# Patient Record
Sex: Male | Born: 1980
Health system: Southern US, Community
[De-identification: ages and names within clinical notes are randomized; demographics above are authoritative.]

---

## 2012-06-02 ENCOUNTER — Emergency Department (HOSPITAL_COMMUNITY): Payer: Commercial Managed Care - PPO

## 2012-06-02 ENCOUNTER — Emergency Department (HOSPITAL_COMMUNITY)
Admission: EM | Admit: 2012-06-02 | Discharge: 2012-06-02 | Disposition: A | Discharge status: Home / Self Care | Payer: Commercial Managed Care - PPO | Attending: Emergency Medicine | Admitting: Emergency Medicine

## 2012-06-02 ENCOUNTER — Encounter (HOSPITAL_COMMUNITY): Payer: Self-pay | Admitting: *Deleted

## 2012-06-02 DIAGNOSIS — Z79899 Other long term (current) drug therapy: Secondary | ICD-10-CM | POA: Insufficient documentation

## 2012-06-02 DIAGNOSIS — M545 Low back pain, unspecified: Secondary | ICD-10-CM | POA: Insufficient documentation

## 2012-06-02 DIAGNOSIS — M549 Dorsalgia, unspecified: Secondary | ICD-10-CM

## 2012-06-02 MED ORDER — KETOROLAC TROMETHAMINE 60 MG/2ML IM SOLN
60.0000 mg | Freq: Once | INTRAMUSCULAR | Status: AC
  Administered 2012-06-02: 60 mg via INTRAMUSCULAR
  Filled 2012-06-02: qty 2

## 2012-06-02 MED ORDER — MELOXICAM 15 MG PO TABS: 15.0000 mg | ORAL_TABLET | Freq: Every day | ORAL | Status: DC

## 2012-06-02 MED ORDER — DEXAMETHASONE SODIUM PHOSPHATE 10 MG/ML IJ SOLN
10.0000 mg | Freq: Once | INTRAMUSCULAR | Status: AC
  Administered 2012-06-02: 10 mg via INTRAMUSCULAR
  Filled 2012-06-02: qty 1

## 2012-06-02 MED ORDER — HYDROCODONE-ACETAMINOPHEN 5-325 MG PO TABS: 1.0000 | ORAL_TABLET | ORAL | Status: DC | PRN

## 2012-06-02 NOTE — ED Notes (Signed)
PA at bedside.

## 2012-06-02 NOTE — ED Provider Notes (Signed)
History     CSN: [id number]  Arrival date & time 06/02/12  1239   First MD Initiated Contact with Patient 06/02/12 1257      Chief Complaint  Patient presents with  . Back Pain    (Consider location/radiation/quality/duration/timing/severity/associated sxs/prior treatment) HPI Comments:  Ricardo Shah is a 32 y.o. male who complains of low back pain for 1 month, the patient has had acutely worsening back pain over the past week. Today he reached out to open a drawer and had severe back pain and spasm. Positional with bending or lifting, without radiation down the legs.  Prior history of back problems: no prior back problems. There is no numbness in the legs.Denies weakness, loss of bowel/bladder function or saddle anesthesia. Denies neck stiffness, headache, rash.  Denies fever or recent procedures to back.        PLAN: For acute pain, rest, intermittent application of heat (do not sleep on heating pad), analgesics and muscle relaxants are recommended. Discussed longer term treatment plan of prn NSAID's and discussed a home back care exercise program with flexion exercise routine. Proper lifting with avoidance of heavy lifting discussed. Consider Physical Therapy and XRay studies if not improving. Call or return to clinic prn if these symptoms worsen or fail to improve as anticipated.   Patient is a 32 y.o. male presenting with back pain. The history is provided by the patient. No language interpreter was used.  Back Pain Location:  Lumbar spine and gluteal region Quality:  Aching Radiates to:  Does not radiate Pain severity:  Severe Pain is:  Worse during the day Onset quality:  Sudden Timing:  Constant Progression:  Worsening Chronicity:  New Context: not emotional stress, not falling, not jumping from heights, not lifting heavy objects, not MCA, not MVA, not occupational injury, not pedestrian accident, not physical stress, not recent illness, not recent injury and  not twisting   Relieved by:  Lying down Worsened by:  Ambulation, bending, coughing and deep breathing Associated symptoms: no abdominal pain, no abdominal swelling, no bladder incontinence, no bowel incontinence, no chest pain, no dysuria, no fever, no headaches, no leg pain, no numbness, no paresthesias, no pelvic pain, no perianal numbness, no tingling, no weakness and no weight loss   Risk factors: lack of exercise   Risk factors: no hx of cancer, no hx of osteoporosis, not obese, not pregnant, no recent surgery, no steroid use and no vascular disease     History reviewed. No pertinent past medical history.  History reviewed. No pertinent past surgical history.  History reviewed. No pertinent family history.  History  Substance Use Topics  . Smoking status: Never Smoker   . Smokeless tobacco: Not on file  . Alcohol Use: 1.8 oz/week    3 Cans of beer per week      Review of Systems  Constitutional: Negative for fever and weight loss.  Cardiovascular: Negative for chest pain.  Gastrointestinal: Negative for abdominal pain and bowel incontinence.  Genitourinary: Negative for bladder incontinence, dysuria and pelvic pain.  Musculoskeletal: Positive for back pain.  Neurological: Negative for tingling, weakness, numbness, headaches and paresthesias.    Allergies  Review of patient's allergies indicates no known allergies.  Home Medications   Current Outpatient Rx  Name  Route  Sig  Dispense  Refill  . carisoprodol (SOMA) 250 MG tablet   Oral   Take 350 mg by mouth at bedtime as needed. For pain         .  DULoxetine (CYMBALTA) 30 MG capsule   Oral   Take 30 mg by mouth daily.           BP 135/88  Pulse 87  Temp(Src) 98 F (36.7 C)  Resp 20  SpO2 99%  Physical Exam BP 135/88  Pulse 87  Temp(Src) 98 F (36.7 C)  Resp 20  SpO2 99%  CV: RRR, No M/R/G, Peripheral pulses intact. No peripheral edema. Lungs: CTAB Abd: Soft, Non tender, non distended Patient  appears to be in mild to moderate pain, antalgic gait noted. Lumbosacral spine area reveals no local tenderness or mass.  Painful and reduced LS ROM noted. Straight leg raise is negative}. DTR's, motor strength and sensation normal, including heel and toe gait.  Peripheral pulses are palpable. X-Ray: no fracture or dislocation noted. ED Course  Procedures (including critical care time)  Labs Reviewed - No data to display Dg Lumbar Spine Complete  06/02/2012   *RADIOLOGY REPORT*  Clinical Data: Low back/hip pain  LUMBAR SPINE - COMPLETE 4+ VIEW  Comparison: None.  Findings: Five lumbar-type vertebral bodies.  Normal lumbar lordosis.  No evidence of fracture or dislocation.  The vertebral body heights and intervertebral disc spaces are maintained.  Visualized bony pelvis appears intact.  Very mild degenerative changes at L3-4.  IMPRESSION: No fracture or dislocation is seen.   Original Report Authenticated By: Charline Bills, M.D.   Dg Sacrum/coccyx  06/02/2012   *RADIOLOGY REPORT*  Clinical Data: Low back/hip pain  SACRUM AND COCCYX - 2+ VIEW  Comparison: None.  Findings: No displaced sacral brachial fracture is seen.  Visualized bony pelvis appears intact.  Lower lumbar spine is within normal limits.  IMPRESSION: No displaced sacrococcygeal fracture is seen.   Original Report Authenticated By: Charline Bills, M.D.   Dg Hip Bilateral Vito Berger  06/02/2012   *RADIOLOGY REPORT*  Clinical Data: Low back/hip pain  BILATERAL HIP WITH PELVIS - 4+ VIEW  Comparison: None.  Findings: No fracture or dislocation is seen.  Bilateral hip joint spaces are preserved.  Lower lumbar spine is within normal limits.  Visualized bony pelvis appears intact.  IMPRESSION: No acute osseous abnormality is seen.   Original Report Authenticated By: Charline Bills, M.D.     1. Back pain       MDM  7:25 PM Patient with back pain.  No neurological deficits and normal neuro exam.  Patient can walk but states is painful.   No loss of bowel or bladder control.  No concern for cauda equina.  No fever, night sweats, weight loss, h/o cancer, IVDU.  RICE protocol and pain medicine indicated and discussed with patient.  F/U appointment tomorrow pcp.        [name], PA-C 06/04/12 1926

## 2012-06-02 NOTE — ED Notes (Signed)
Pt reports lower back pain x couple of weeks.  States that he went to his PCP yesterday and was given soma for muscle relaxer without relief.  States that today, his back 'gave out' and he was unable to ambulate without pain.  Pt did have a urinalysis yesterday that was clear.

## 2012-06-05 NOTE — ED Provider Notes (Signed)
  Medical screening examination/treatment/procedure(s) were performed by non-physician practitioner and as supervising physician I was immediately available for consultation/collaboration.    Davena Julian D Willye Javier, MD 06/05/12 0720 

## 2014-08-29 IMAGING — CR DG SACRUM/COCCYX 2+V
3 series · 3 of 3 positions shown · non-contrast
Comparison: None.

CLINICAL DATA: Low back/hip pain

SACRUM AND COCCYX - 2+ VIEW

[t sacrum a.p.]
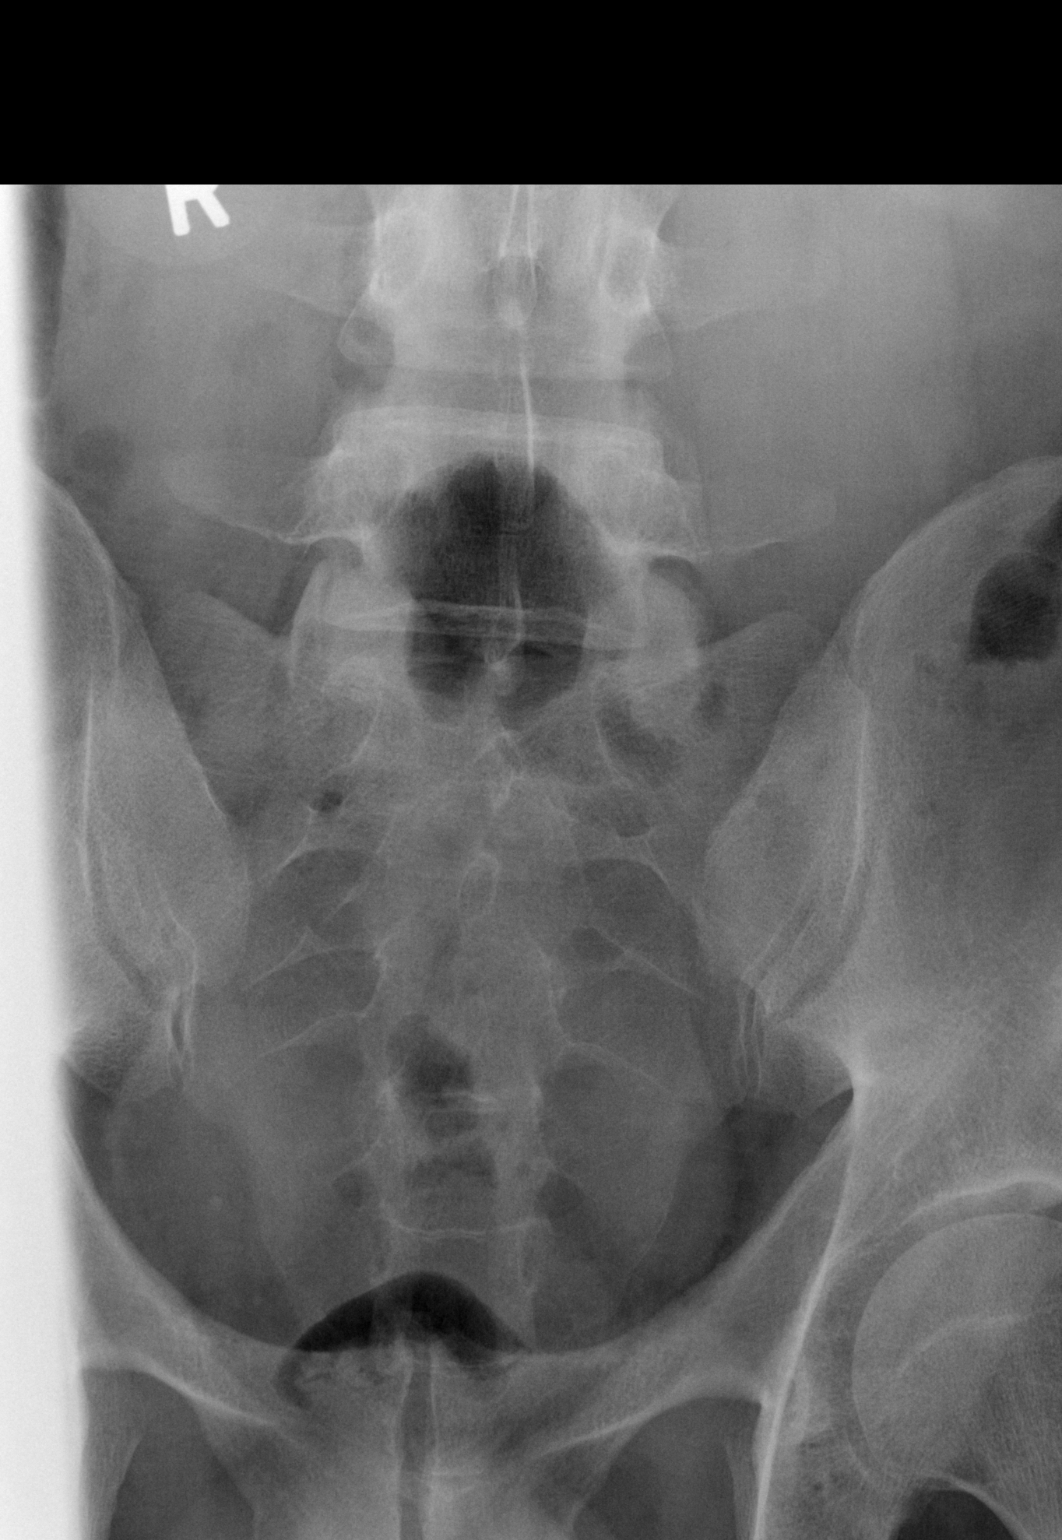

[t sacrum lat (1 of 2)]
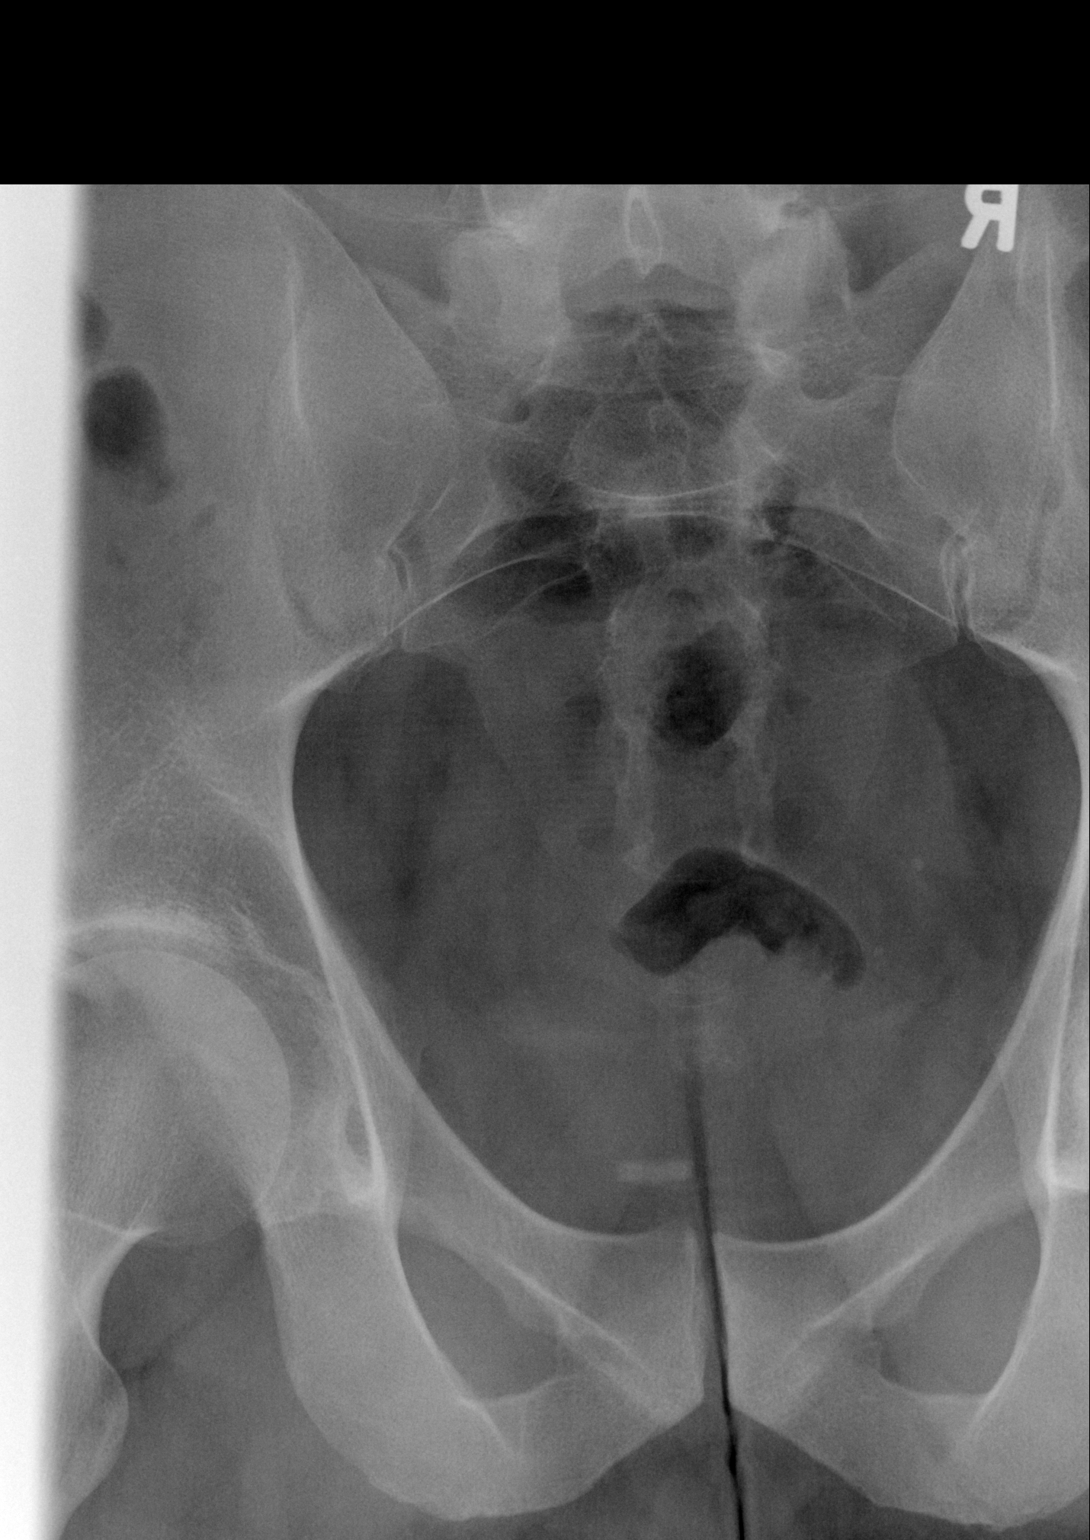

[t sacrum lat (2 of 2)]
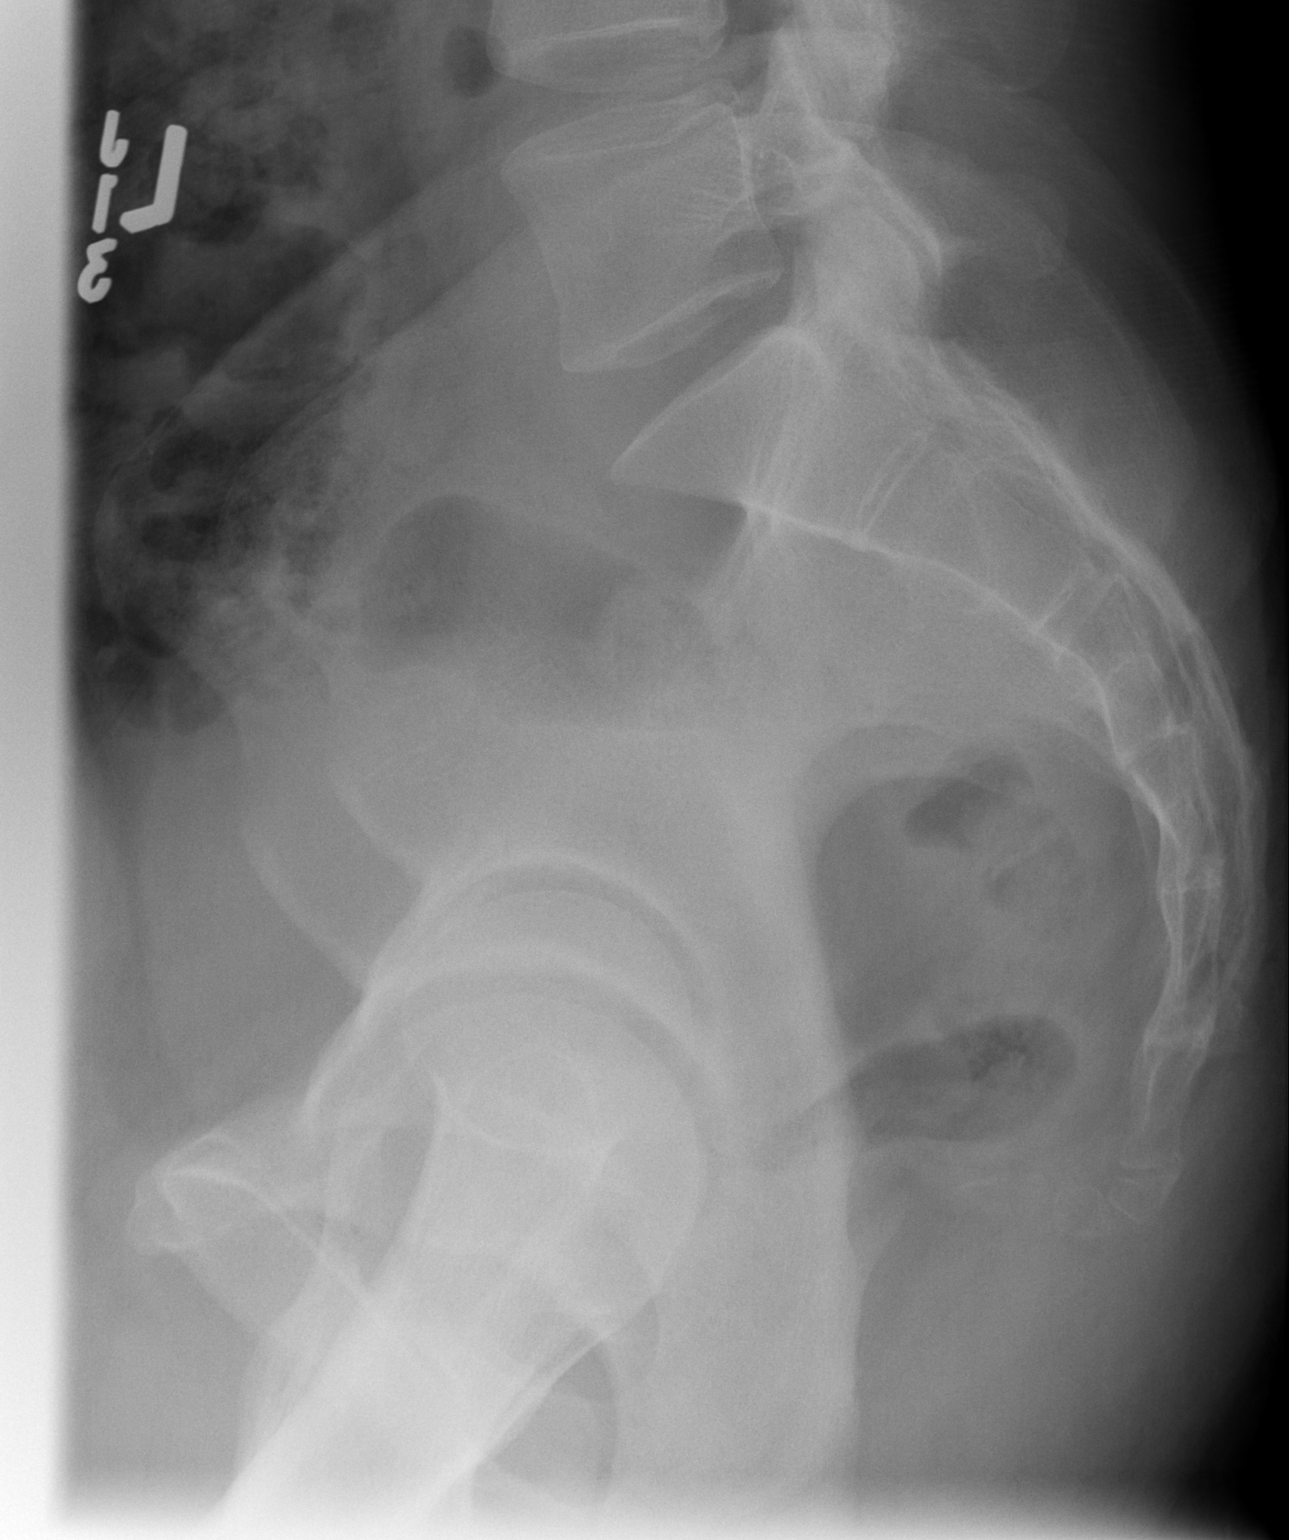

[3 of 3 positions shown; findings below may reference images not displayed]

FINDINGS: No displaced sacral brachial fracture is seen.

Visualized bony pelvis appears intact.

Lower lumbar spine is within normal limits.
IMPRESSION: No displaced sacrococcygeal fracture is seen.

## 2014-08-29 IMAGING — CR DG LUMBAR SPINE COMPLETE 4+V
5 series · 5 of 5 positions shown · non-contrast
Comparison: None.

CLINICAL DATA: Low back/hip pain

LUMBAR SPINE - COMPLETE 4+ VIEW

[t l-spine a.p.]
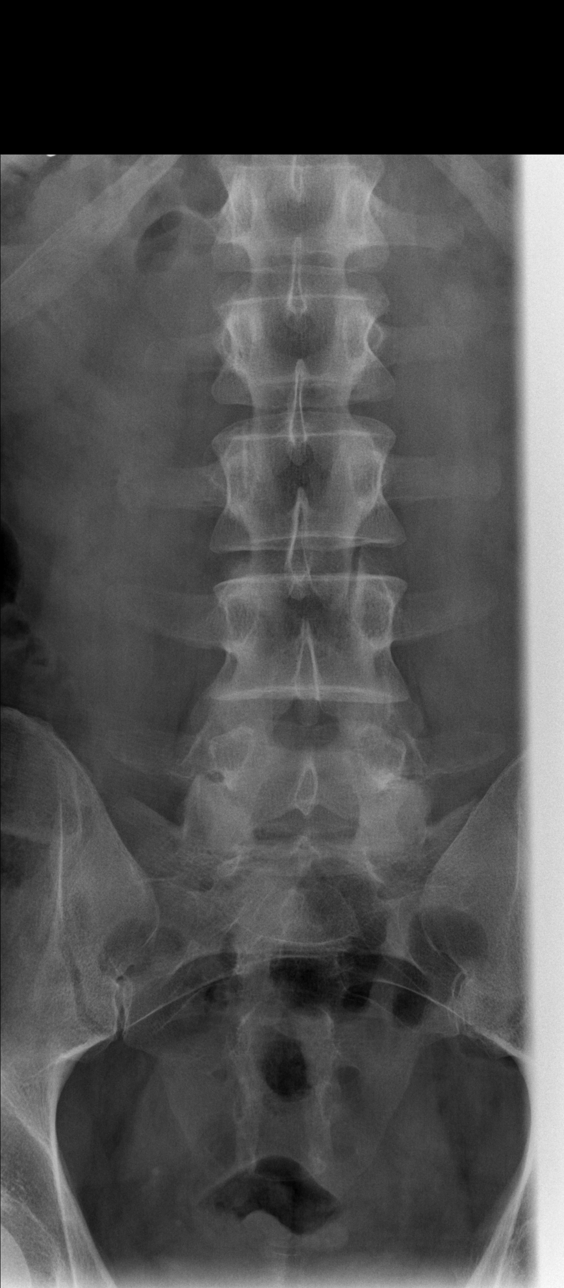

[t l-spine oblique exposure (1 of 2)]
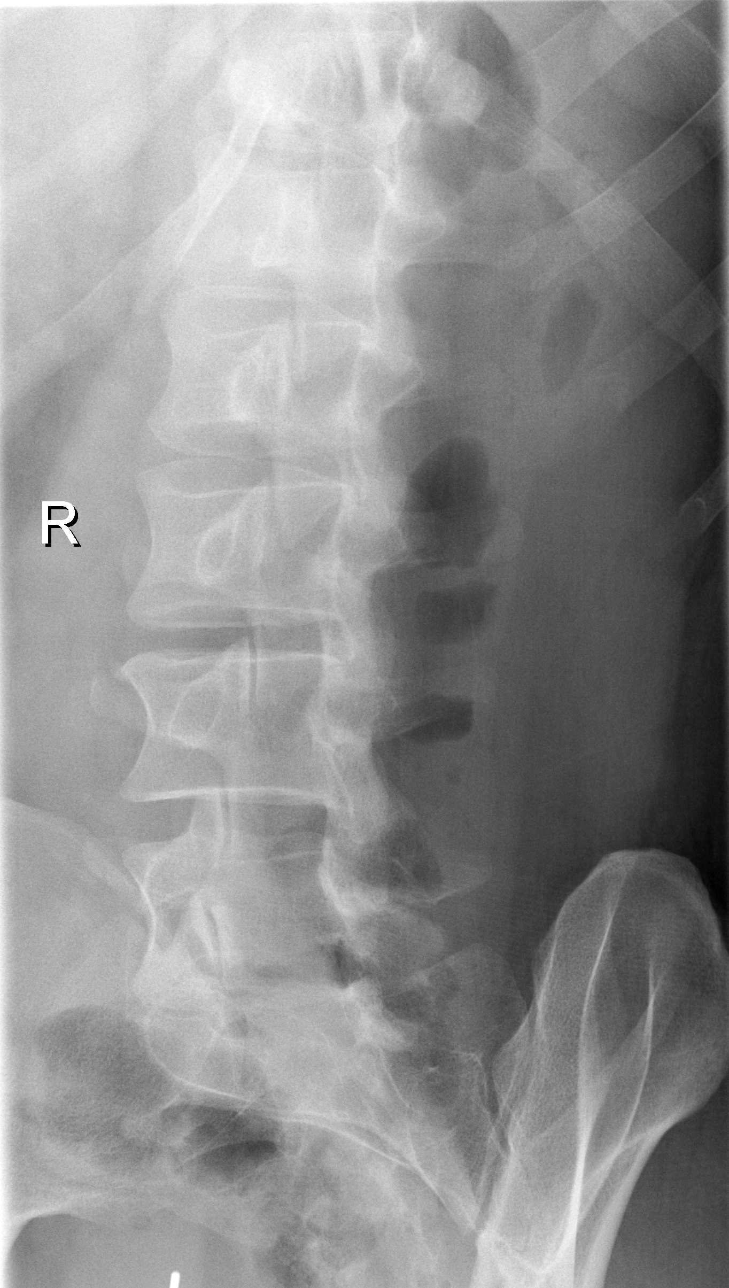

[t l-spine oblique exposure (2 of 2)]
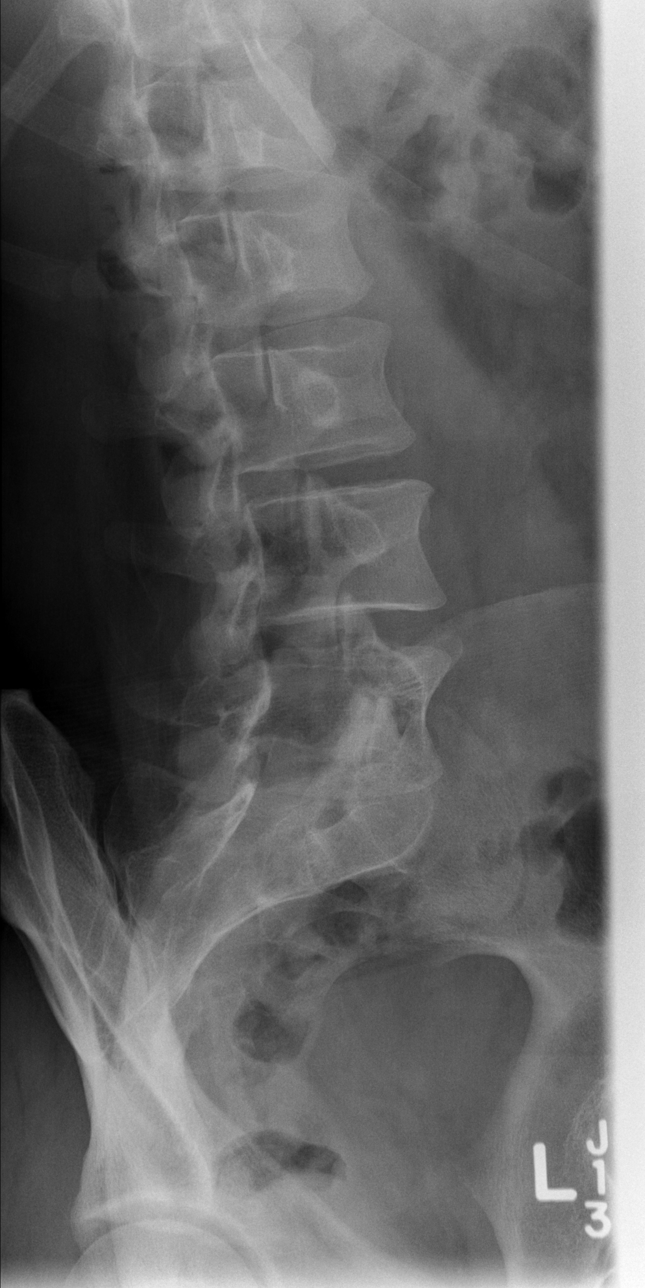

[t l-spine lat]
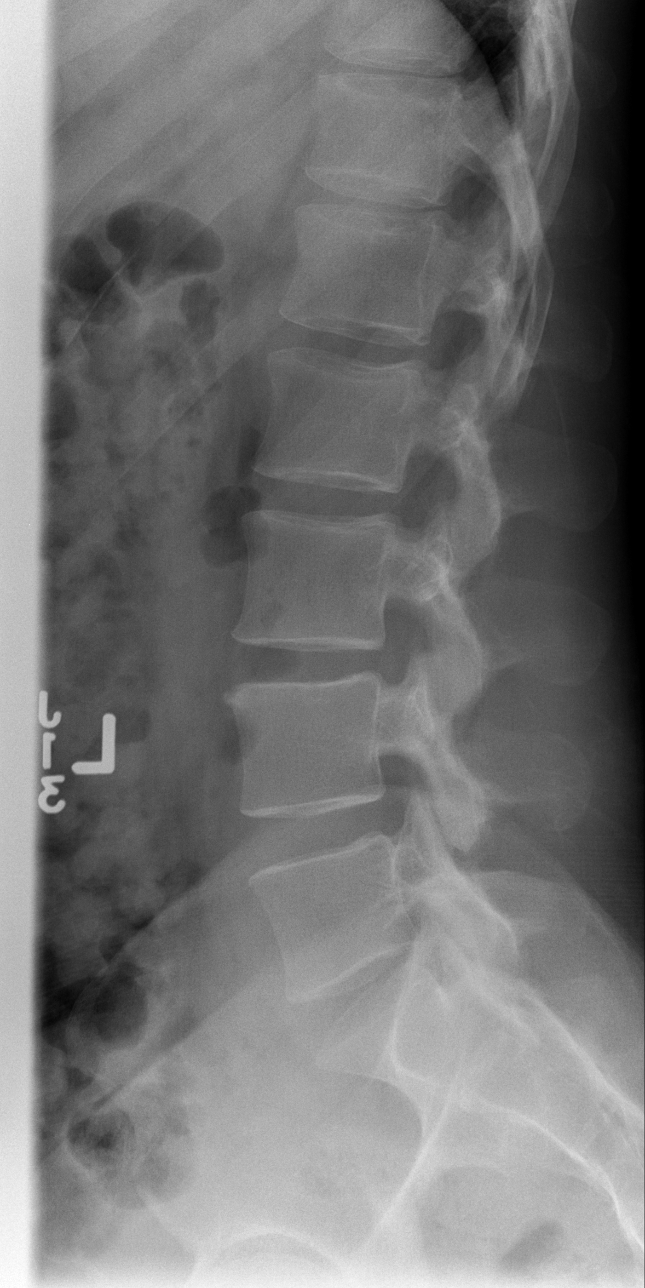

[t l-spine l5-s1 spot]
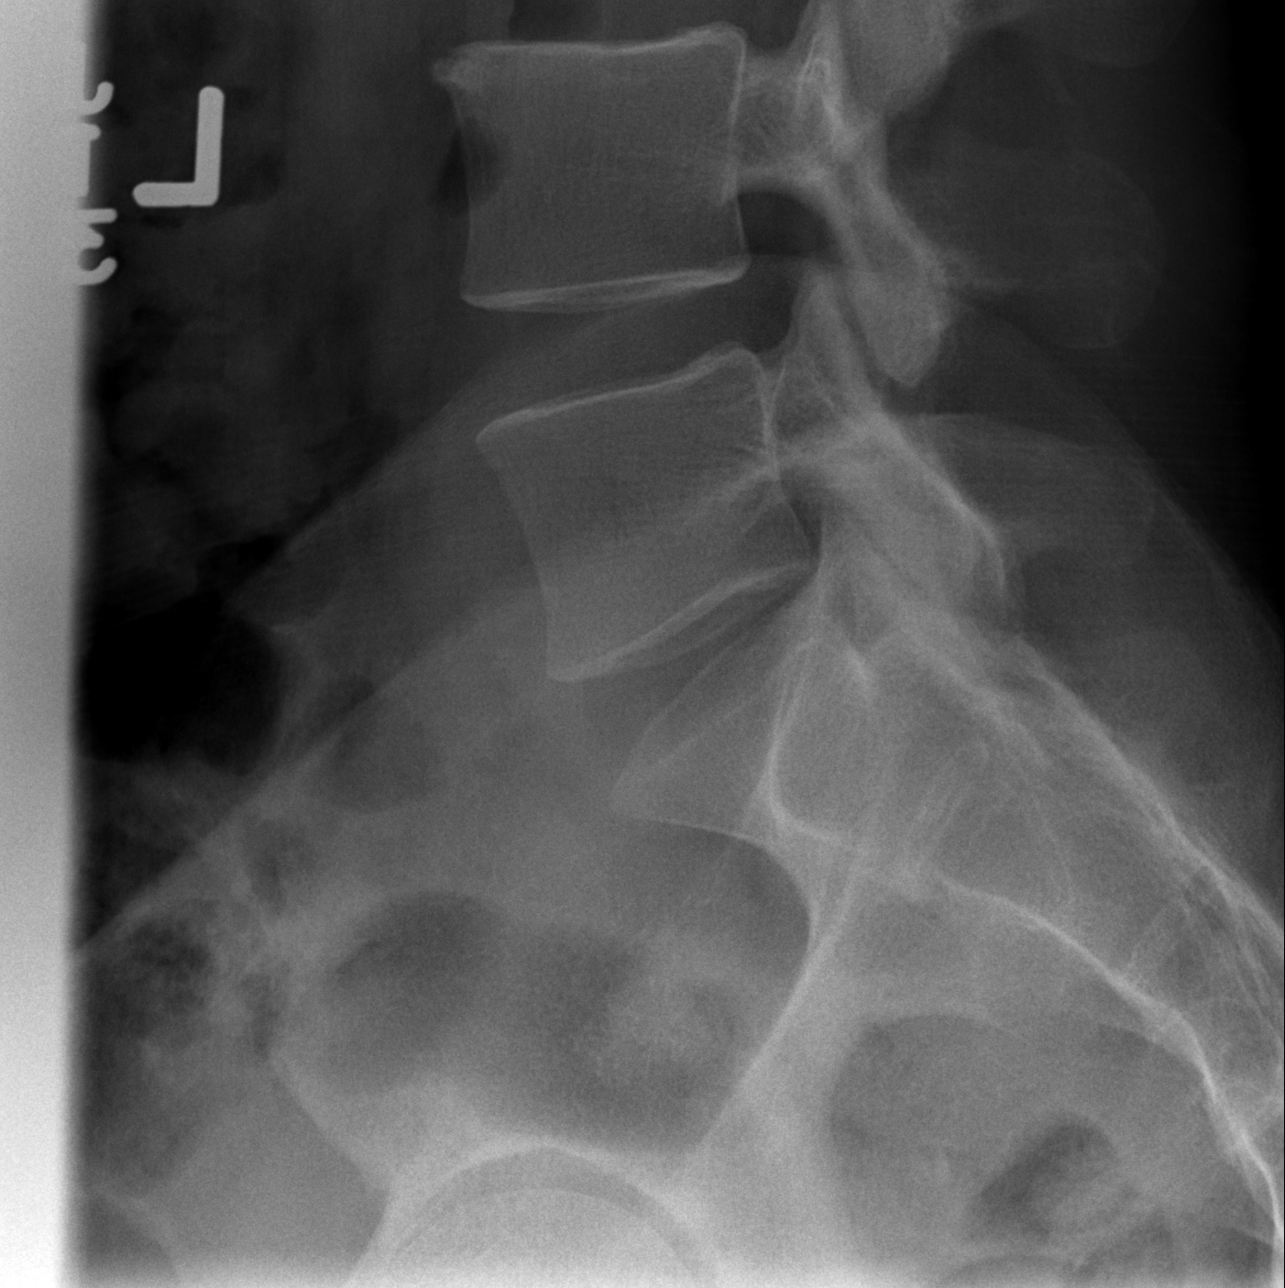

[5 of 5 positions shown; findings below may reference images not displayed]

FINDINGS: Five lumbar-type vertebral bodies.

Normal lumbar lordosis.

No evidence of fracture or dislocation.  The vertebral body heights
and intervertebral disc spaces are maintained.

Visualized bony pelvis appears intact.

Very mild degenerative changes at L3-4.
IMPRESSION: No fracture or dislocation is seen.

## 2018-06-21 ENCOUNTER — Telehealth: Payer: Self-pay | Admitting: Hematology

## 2018-06-21 DIAGNOSIS — Z20822 Contact with and (suspected) exposure to covid-19: Secondary | ICD-10-CM

## 2018-06-21 NOTE — Telephone Encounter (Signed)
Referred request for testing by Silverio Decamp, NP from occupational health / Spoke with patient and test ordered and patient scheduled

## 2018-06-22 ENCOUNTER — Other Ambulatory Visit: Payer: Commercial Managed Care - PPO

## 2018-06-25 LAB — NOVEL CORONAVIRUS, NAA: SARS-CoV-2, NAA: NOT DETECTED

## 2019-08-16 ENCOUNTER — Ambulatory Visit: Payer: Commercial Managed Care - PPO | Attending: Internal Medicine

## 2019-08-16 DIAGNOSIS — Z23 Encounter for immunization: Secondary | ICD-10-CM

## 2019-08-16 NOTE — Progress Notes (Signed)
° °  Covid-19 Vaccination Clinic  Name:  Ricardo Shah    MRN: [id number] DOB: [birth year]  08/16/2019  Mr. Ricardo Shah was observed post Covid-19 immunization for 15 minutes without incident. He was provided with Vaccine Information Sheet and instruction to access the V-Safe system.   Mr. Ricardo Shah was instructed to call 911 with any severe reactions post vaccine:  Difficulty breathing   Swelling of face and throat   A fast heartbeat   A bad rash all over body   Dizziness and weakness   Immunizations Administered    Name Date Dose VIS Date Route   Pfizer COVID-19 Vaccine 08/16/2019 10:37 AM 0.3 mL 03/01/2018 Intramuscular   Manufacturer: [employer], [employer]   Lot: [id number]   NDC: [address]-5

## 2019-10-24 ENCOUNTER — Telehealth: Payer: Self-pay | Admitting: *Deleted

## 2019-10-24 NOTE — Telephone Encounter (Signed)
-----   Message from Jimmey Ralph sent at 10/24/2019  1:46 PM EDT ----- Regarding: RE: new pt apt with I scheduled this patient on 11/08 at 1:20 with Dr. Shari Prows.  ----- Message ----- From: Loa Socks, LPN Sent: [id number]  12:25 PM EDT To: [facility] Scheduling Subject: FW: new pt apt with                            Please schedule this pt a new pt appt with Dr. Shari Prows per Carlean Jews.  Send triage the date.  Thanks so much, Normagene Harvie  ----- Message ----- From: Janetta Hora, PA-C Sent: 10/24/2019  12:16 PM EDT To: [facility] Triage Subject: new pt apt with                                Can you make a new pt apt for this guy with Dr. Shari Prows? He will need to be called with the apt info. Thanks!  KT

## 2019-11-12 NOTE — Progress Notes (Signed)
Cardiology Office Note:    Date:  11/13/2019   ID:  Ricardo Shah, DOB 1980-03-26, MRN [id number]  PCP:  Endocrinology, [facility] Cardiologist:  No primary care provider on file.  [facility] Electrophysiologist:  None   Referring MD: No ref. provider found    History of Present Illness:    Ricardo Shah is a 39 y.o. male with a hx of depression and positive family history who was referred by Carlean Jews, PA-C for further evaluation of cardiac risk factors.   Patient states that he is concerned about his CV health as his father has Afib and valvular heart disease and he wants to establish care with a cardiology provider. He feels overall well but notes he becomes more "winded" with exertion that he attributes to being out of shape and gaining weight. No exertional chest pain. No nausea, vomiting, abdominal symptoms, fevers, chills, orthopnea, PND, dizziness or syncope. He reports he has been more sedentary and doesn't exercise as much as he would like to but is trying to lose weight. He has cut out alcohol and is trying to eat healthier.   Patient snores and has been tested for sleep apnea in the past and was negative.    Family history: Dad with afib and valve replacement. Paternal grandfather with MI in 60-70s. Uncle with MI in 52s.   No past medical history on file.  No past surgical history on file.  Current Medications: Current Meds  Medication Sig  . buPROPion (WELLBUTRIN XL) 150 MG 24 hr tablet Take 150 mg by mouth at bedtime.  . carisoprodol (SOMA) 250 MG tablet Take 350 mg by mouth at bedtime as needed. For pain  . DULoxetine (CYMBALTA) 30 MG capsule Take 30 mg by mouth daily.  . DULoxetine (CYMBALTA) 60 MG capsule Take 60 mg by mouth daily.  Marland Kitchen HYDROcodone-acetaminophen (NORCO/VICODIN) 5-325 MG per tablet Take 1-2 tablets by mouth every 4 (four) hours as needed for pain.  Marland Kitchen lamoTRIgine (LAMICTAL) 100 MG tablet Take 200 mg by mouth  daily.   . meloxicam (MOBIC) 15 MG tablet Take 1 tablet (15 mg total) by mouth daily. Take 1 daily with food.     Allergies:   Patient has no known allergies.   Social History   Socioeconomic History  . Marital status: Single    Spouse name: Not on file  . Number of children: Not on file  . Years of education: Not on file  . Highest education level: Not on file  Occupational History  . Not on file  Tobacco Use  . Smoking status: Former Games developer  . Smokeless tobacco: Never Used  Substance and Sexual Activity  . Alcohol use: Yes    Alcohol/week: 3.0 standard drinks    Types: 3 Cans of beer per week  . Drug use: No  . Sexual activity: Not on file  Other Topics Concern  . Not on file  Social History Narrative  . Not on file   Social Determinants of Health   Financial Resource Strain:   . Difficulty of Paying Living Expenses: Not on file  Food Insecurity:   . Worried About Programme researcher, broadcasting/film/video in the Last Year: Not on file  . Ran Out of Food in the Last Year: Not on file  Transportation Needs:   . Lack of Transportation (Medical): Not on file  . Lack of Transportation (Non-Medical): Not on file  Physical Activity:   . Days of Exercise per Week: Not on file  .  Minutes of Exercise per Session: Not on file  Stress:   . Feeling of Stress : Not on file  Social Connections:   . Frequency of Communication with Friends and Family: Not on file  . Frequency of Social Gatherings with Friends and Family: Not on file  . Attends Religious Services: Not on file  . Active Member of Clubs or Organizations: Not on file  . Attends Banker Meetings: Not on file  . Marital Status: Not on file     Family History: Father with Afib and valvular heart disease Grandfather with history of MI Uncle with MI  ROS:   Please see the history of present illness.    Review of Systems  Constitutional: Negative for chills and fever.  HENT: Negative for hearing loss.   Eyes: Negative  for blurred vision.  Respiratory: Negative for cough and shortness of breath.   Cardiovascular: Negative for chest pain, palpitations, orthopnea, claudication, leg swelling and PND.  Gastrointestinal: Negative for abdominal pain, heartburn, nausea and vomiting.  Genitourinary: Negative for hematuria.  Musculoskeletal: Negative for myalgias.  Neurological: Negative for dizziness and loss of consciousness.  Psychiatric/Behavioral: Negative for substance abuse.    EKGs/Labs/Other Studies Reviewed:    The following studies were reviewed today: No cardiac studies on file  EKG:  EKG is  ordered today.  The ekg ordered today demonstrates sinus tachycardia with HR 101.  Recent Labs: No results found for requested labs within last 8760 hours.  Recent Lipid Panel No results found for: CHOL, TRIG, HDL, CHOLHDL, VLDL, LDLCALC, LDLDIRECT    Physical Exam:    VS:  BP (!) 144/90   Pulse (!) 102   Ht 6' (1.829 m)   Wt 224 lb (101.6 kg)   SpO2 98%   BMI 30.38 kg/m     Wt Readings from Last 3 Encounters:  11/13/19 224 lb (101.6 kg)     GEN:  Well nourished, well developed in no acute distress HEENT: Normal NECK: No JVD; No carotid bruits CARDIAC: RRR, no murmurs, rubs, gallops RESPIRATORY:  Clear to auscultation without rales, wheezing or rhonchi  ABDOMEN: Soft, non-tender, non-distended MUSCULOSKELETAL:  No edema; No deformity  SKIN: Warm and dry NEUROLOGIC:  Alert and oriented x 3 PSYCHIATRIC:  Normal affect   ASSESSMENT:    1. SOB (shortness of breath)   2. Elevated blood pressure reading    PLAN:    In order of problems listed above:  #CV risk factor modification: Patient is concerned about his overall CV risk and hopes to prevent future events. He would really like to pursue coronary calcium scoring as he had an uncle with a MI at a relatively young age and CVD runs in his family. He is also concerned that he becomes more fatigued with exercise which he attributes to  being out of shape, but wants to ensure it is not his heart. We discussed that it is reasonable to defer testing for another several years, however, he would like to pursue it now given his family history. -Check coronary calcium score -Check lipid panel for risk stratification  #Elevated blood pressure: Blood pressure elevated 140s in office. Does not monitor at home. -Check blood pressure at home and log results -Encouraged lifestyle modifications including healthy diet, regular exercise and minimizing intake of salt and alchol -Will continue to monitor   Shared Decision Making/Informed Consent     Medication Adjustments/Labs and Tests Ordered: Current medicines are reviewed at length with the patient today.  Concerns regarding medicines are outlined above.  Orders Placed This Encounter  Procedures  . CT CARDIAC SCORING  . Lipid panel  . EKG 12-Lead   No orders of the defined types were placed in this encounter.   Patient Instructions  Medication Instructions:  Your physician recommends that you continue on your current medications as directed. Please refer to the Current Medication list given to you today.  *If you need a refill on your cardiac medications before your next appointment, please call your pharmacy*   Lab Work: Lipid panel when fasting (nothing to eat or drink after midnight except water and black coffee). If you have labs (blood work) drawn today and your tests are completely normal, you will receive your results only by: Marland Kitchen MyChart Message (if you have MyChart) OR . A paper copy in the mail If you have any lab test that is abnormal or we need to change your treatment, we will call you to review the results.   Testing/Procedures: Your physician recommends that you have a Calcium Score performed.   Follow-Up: At [facility], you and your health needs are our priority.  As part of our continuing mission to provide you with exceptional heart care, we have  created designated Provider Care Teams.  These Care Teams include your primary Cardiologist (physician) and Advanced Practice Providers (APPs -  Physician Assistants and Nurse Practitioners) who all work together to provide you with the care you need, when you need it.  We recommend signing up for the patient portal called "MyChart".  Sign up information is provided on this After Visit Summary.  MyChart is used to connect with patients for Virtual Visits (Telemedicine).  Patients are able to view lab/test results, encounter notes, upcoming appointments, etc.  Non-urgent messages can be sent to your provider as well.   To learn more about what you can do with MyChart, go to [email or web address].    Your next appointment:   12 month(s)  The format for your next appointment:   In Person  Provider:   You may see Laurance Flatten, MD or one of the following Advanced Practice Providers on your designated Care Team:    Tereso Newcomer, PA-C  Chelsea Aus, [place]    Other Instructions      Signed, Meriam Sprague, MD  11/13/2019 4:08 PM    [facility]

## 2019-11-13 ENCOUNTER — Encounter: Payer: Self-pay | Admitting: Cardiology

## 2019-11-13 ENCOUNTER — Ambulatory Visit (INDEPENDENT_AMBULATORY_CARE_PROVIDER_SITE_OTHER): Payer: BC Managed Care – PPO | Admitting: Cardiology

## 2019-11-13 ENCOUNTER — Other Ambulatory Visit: Payer: Self-pay

## 2019-11-13 VITALS — BP 144/90 | HR 102 | Ht 72.0 in | Wt 224.0 lb

## 2019-11-13 DIAGNOSIS — R0602 Shortness of breath: Secondary | ICD-10-CM

## 2019-11-13 DIAGNOSIS — R030 Elevated blood-pressure reading, without diagnosis of hypertension: Secondary | ICD-10-CM

## 2019-11-13 NOTE — Patient Instructions (Signed)
Medication Instructions:  Your physician recommends that you continue on your current medications as directed. Please refer to the Current Medication list given to you today.  *If you need a refill on your cardiac medications before your next appointment, please call your pharmacy*   Lab Work: Lipid panel when fasting (nothing to eat or drink after midnight except water and black coffee). If you have labs (blood work) drawn today and your tests are completely normal, you will receive your results only by: Marland Kitchen MyChart Message (if you have MyChart) OR . A paper copy in the mail If you have any lab test that is abnormal or we need to change your treatment, we will call you to review the results.   Testing/Procedures: Your physician recommends that you have a Calcium Score performed.   Follow-Up: At [facility], you and your health needs are our priority.  As part of our continuing mission to provide you with exceptional heart care, we have created designated Provider Care Teams.  These Care Teams include your primary Cardiologist (physician) and Advanced Practice Providers (APPs -  Physician Assistants and Nurse Practitioners) who all work together to provide you with the care you need, when you need it.  We recommend signing up for the patient portal called "MyChart".  Sign up information is provided on this After Visit Summary.  MyChart is used to connect with patients for Virtual Visits (Telemedicine).  Patients are able to view lab/test results, encounter notes, upcoming appointments, etc.  Non-urgent messages can be sent to your provider as well.   To learn more about what you can do with MyChart, go to [email or web address].    Your next appointment:   12 month(s)  The format for your next appointment:   In Person  Provider:   You may see Laurance Flatten, MD or one of the following Advanced Practice Providers on your designated Care Team:    Tereso Newcomer, PA-C  Chelsea Aus,  [place]    Other Instructions

## 2019-12-06 ENCOUNTER — Other Ambulatory Visit: Payer: BC Managed Care – PPO

## 2019-12-06 ENCOUNTER — Inpatient Hospital Stay: Admission: RE | Admit: 2019-12-06 | Payer: BC Managed Care – PPO | Source: Ambulatory Visit

## 2019-12-12 ENCOUNTER — Other Ambulatory Visit: Payer: BC Managed Care – PPO

## 2019-12-12 ENCOUNTER — Inpatient Hospital Stay: Admission: RE | Admit: 2019-12-12 | Payer: BC Managed Care – PPO | Source: Ambulatory Visit

## 2019-12-27 ENCOUNTER — Inpatient Hospital Stay: Admission: RE | Admit: 2019-12-27 | Payer: Self-pay | Source: Ambulatory Visit

## 2020-01-15 ENCOUNTER — Telehealth: Payer: Self-pay | Admitting: Cardiology

## 2020-01-15 NOTE — Telephone Encounter (Signed)
Spoke with pt who complains of HA on left side of his head for 5-6 days along with neck soreness.  Pt states it does feel like a tension HA but Ibuprofen has not helped.  Pt states HA increases throughout the day.  Pt does not check his BP regularly.  Encouraged pt to purchase a BP cuff if he feels his BP is running high and take daily readings about the same time each day x 5 days unless critically high and provide Dr Shari Prows with readings.  Pt also encouraged to contact his PCP re: HA symptoms.  Pt verbalizes understanding and agrees with current plan.

## 2020-01-15 NOTE — Telephone Encounter (Signed)
Ricardo Shah is calling stating he has been having a headache for the past 5-6 days on the left side. He states his neck is also sore and both the headache and neck soreness progressively gets worse through out the day. He has been tested for Covid to be sure it is not that and the test came back negative. He is wanting to know if this headache could be due to his BP that tends to run high. He states he has not recently checked his BP, but will when he gets to the office. Please advise.

## 2020-01-16 ENCOUNTER — Other Ambulatory Visit: Payer: Self-pay | Admitting: Cardiology

## 2020-01-16 MED ORDER — AMLODIPINE BESYLATE 2.5 MG PO TABS: 2.5000 mg | ORAL_TABLET | Freq: Every day | ORAL | 3 refills | Status: DC

## 2020-01-29 ENCOUNTER — Other Ambulatory Visit: Payer: Self-pay | Admitting: Physician Assistant

## 2020-01-29 ENCOUNTER — Telehealth (HOSPITAL_COMMUNITY): Payer: Self-pay

## 2020-01-29 MED ORDER — MOLNUPIRAVIR EUA 200MG CAPSULE: 4.0000 | ORAL_CAPSULE | Freq: Two times a day (BID) | ORAL | 0 refills | Status: AC

## 2020-01-29 MED FILL — MOLNUPIRAVIR 200 MG CAPS: 200 | 5 days supply | Qty: 40 | Fill #0

## 2020-01-29 NOTE — Progress Notes (Signed)
Outpatient Oral COVID Treatment Note  I connected with Ricardo Shah on 01/29/2020/9:01 AM by telephone and verified that I am speaking with the correct person using two identifiers.  I discussed the limitations, risks, security, and privacy concerns of performing an evaluation and management service by telephone and the availability of in person appointments. I also discussed with the patient that there may be a patient responsible charge related to this service. The patient expressed understanding and agreed to proceed.  Patient location: home Provider location: office  Diagnosis: COVID-19 infection  Purpose of visit: Discussion of potential use of Molnupiravir or Paxlovid, a new treatment for mild to moderate COVID-19 viral infection in non-hospitalized patients.   Subjective: Patient is a 40 y.o. male who has been diagnosed with COVID 19 viral infection.  Their symptoms began on 1/22 with sore throat, body aches, fevers.    History:  HTN Obesity with a BMI of 29  No Known Allergies   Current Outpatient Medications:  .  molnupiravir EUA 200 mg CAPS, Take 4 capsules (800 mg total) by mouth 2 (two) times daily for 5 days., Disp: 40 capsule, Rfl: 0 .  amLODipine (NORVASC) 2.5 MG tablet, Take 1 tablet (2.5 mg total) by mouth daily., Disp: 180 tablet, Rfl: 3 .  buPROPion (WELLBUTRIN XL) 150 MG 24 hr tablet, Take 150 mg by mouth at bedtime., Disp: , Rfl:  .  carisoprodol (SOMA) 250 MG tablet, Take 350 mg by mouth at bedtime as needed. For pain, Disp: , Rfl:  .  DULoxetine (CYMBALTA) 30 MG capsule, Take 30 mg by mouth daily., Disp: , Rfl:  .  DULoxetine (CYMBALTA) 60 MG capsule, Take 60 mg by mouth daily., Disp: , Rfl:  .  HYDROcodone-acetaminophen (NORCO/VICODIN) 5-325 MG per tablet, Take 1-2 tablets by mouth every 4 (four) hours as needed for pain., Disp: 20 tablet, Rfl: 0 .  lamoTRIgine (LAMICTAL) 100 MG tablet, Take 200 mg by mouth daily. , Disp: , Rfl:  .  meloxicam (MOBIC) 15 MG  tablet, Take 1 tablet (15 mg total) by mouth daily. Take 1 daily with food., Disp: 10 tablet, Rfl: 0  Objective: Patient sounds in no apparent distress.  Breathing is non labored.  Mood and behavior are normal.  Laboratory Data:  No results found for this or any previous visit (from the past 2160 hour(s)).   Assessment: 40 y.o. male with mild/moderate COVID 19 viral infection diagnosed on 1/22 at high risk for progression to severe COVID 19.  Plan:  This patient is a 40 y.o. male that meets the following criteria for Emergency Use Authorization of: Molnupiravir  1. Age >18 yr 2. SARS-COV-2 positive test 3. Symptom onset < 5 days 4. Mild-to-moderate COVID disease with high risk for severe progression to hospitalization or death   I have spoken and communicated the following to the patient or parent/caregiver regarding: 1. Molnupiravir is an unapproved drug that is authorized for use under an TEFL teacher.  2. There are no adequate, approved, available products for the treatment of COVID-19 in adults who have mild-to-moderate COVID-19 and are at high risk for progressing to severe COVID-19, including hospitalization or death. 3. Other therapeutics are currently authorized. For additional information on all products authorized for treatment or prevention of COVID-19, please see https://www.graham-miller.com/.  4. There are benefits and risks of taking this treatment as outlined in the "Fact Sheet for Patients and Caregivers."  5. "Fact Sheet for Patients and Caregivers" was reviewed with patient. A hard copy will  be provided to patient from pharmacy prior to the patient receiving treatment. 6. Patients should continue to self-isolate and use infection control measures (e.g., wear mask, isolate, social distance, avoid sharing personal items, clean and disinfect "high touch" surfaces, and  frequent handwashing) according to CDC guidelines.  7. The patient or parent/caregiver has the option to accept or refuse treatment. 8. [employer] has established a pregnancy surveillance program. 9. Females of childbearing potential should use a reliable method of contraception correctly and consistently, as applicable, for the duration of treatment and for 4 days after the last dose of Molnupiravir. 10. Males of reproductive potential who are sexually active with females of childbearing potential should use a reliable method of contraception correctly and consistently during treatment and for at least 3 months after the last dose. 11. Pregnancy status and risk was assessed. Patient verbalized understanding of precautions.   After reviewing above information with the patient, the patient agrees to receive molnupiravir.  Follow up instructions:    . Take prescription BID x 5 days as directed . Reach out to pharmacist for counseling on medication if desired . For concerns regarding further COVID symptoms please follow up with your PCP or urgent care . For urgent or life-threatening issues, seek care at your local emergency department  The patient was provided an opportunity to ask questions, and all were answered. The patient agreed with the plan and demonstrated an understanding of the instructions.   Script sent to [facility] and opted to pick up RX.  The patient was advised to call their PCP or seek an in-person evaluation if the symptoms worsen or if the condition fails to improve as anticipated.   I provided 10 minutes of non face-to-face telephone visit time during this encounter, and > 50% was spent counseling as documented under my assessment & plan.  Cline Crock, PA-C 01/29/2020 /9:01 AM

## 2020-01-29 NOTE — Telephone Encounter (Signed)
Patient was prescribed oral covid treatment Molnupiravir and treatment note was reviewed. Medication has been received by [facility] and reviewed for appropriateness.  Drug Interactions or Dosage Adjustments Noted: none  Delivery Method: pick up  Patient contacted for counseling on 01/29/20 and verbalized understanding.   Delivery or Pick-Up Date: 01/29/20   Kirstie Peri 01/29/2020, 11:43 AM [facility] Outpatient Pharmacist Phone# [phone]

## 2020-02-14 ENCOUNTER — Inpatient Hospital Stay: Admission: RE | Admit: 2020-02-14 | Payer: Self-pay | Source: Ambulatory Visit

## 2020-03-05 ENCOUNTER — Other Ambulatory Visit: Payer: Self-pay | Admitting: Physician Assistant

## 2020-03-05 MED ORDER — AMLODIPINE BESYLATE 5 MG PO TABS: 5.0000 mg | ORAL_TABLET | Freq: Every day | ORAL | 3 refills | Status: DC

## 2021-03-29 ENCOUNTER — Other Ambulatory Visit: Payer: Self-pay | Admitting: Physician Assistant

## 2021-04-17 ENCOUNTER — Other Ambulatory Visit: Payer: Self-pay

## 2021-04-21 ENCOUNTER — Other Ambulatory Visit: Payer: Self-pay

## 2021-04-21 NOTE — Telephone Encounter (Signed)
Yes, he will need to be seen before refill can be granted. ?Thanks! ?

## 2021-04-30 ENCOUNTER — Other Ambulatory Visit: Payer: Self-pay | Admitting: *Deleted

## 2021-04-30 MED ORDER — AMLODIPINE BESYLATE 5 MG PO TABS: 5.0000 mg | ORAL_TABLET | Freq: Every day | ORAL | 2 refills | Status: DC

## 2021-04-30 NOTE — Progress Notes (Signed)
Pt is requesting a refill of his amlodipine, sent to his pharmacy of choice.   ?Pharmacy note placed for pt to reach out to the office and schedule his follow-up appt with Dr. Shari Prows.  ?Refill sent in. ? ?

## 2021-08-18 ENCOUNTER — Other Ambulatory Visit: Payer: Self-pay

## 2021-08-18 MED ORDER — AMLODIPINE BESYLATE 5 MG PO TABS: 5.0000 mg | ORAL_TABLET | Freq: Every day | ORAL | 0 refills | Status: DC

## 2021-09-29 ENCOUNTER — Other Ambulatory Visit: Payer: Self-pay

## 2021-11-30 NOTE — Progress Notes (Unsigned)
Cardiology Office Note:    Date:  11/30/2021   ID:  Ricardo Shah, DOB [birth year], MRN [id number]  PCP:  Endocrinology, [facility]  [facility] Cardiologist:  None  [facility] Electrophysiologist:  None   Referring MD: [facility]*    History of Present Illness:    Ricardo Shah is a 41 y.o. male with a hx of depression and positive family history who presents to clinic for follow-up.  Patient was initially seen in 11/2019 for family history of cardiac disease.  His father has Afib and valvular heart disease. Paternal grandfather with MI in 60-70s. Uncle with MI in 63s. He was doing well at that time. We recommended Ca score but this was not performed. LDL 152, HDL 34, TG 143.   Today, ***  No past medical history on file.  No past surgical history on file.  Current Medications: No outpatient medications have been marked as taking for the 12/02/21 encounter (Appointment) with Meriam Sprague, MD.     Allergies:   Patient has no known allergies.   Social History   Socioeconomic History   Marital status: Single    Spouse name: Not on file   Number of children: Not on file   Years of education: Not on file   Highest education level: Not on file  Occupational History   Not on file  Tobacco Use   Smoking status: Former   Smokeless tobacco: Never  Substance and Sexual Activity   Alcohol use: Yes    Alcohol/week: 3.0 standard drinks of alcohol    Types: 3 Cans of beer per week   Drug use: No   Sexual activity: Not on file  Other Topics Concern   Not on file  Social History Narrative   Not on file   Social Determinants of Health   Financial Resource Strain: Not on file  Food Insecurity: Not on file  Transportation Needs: Not on file  Physical Activity: Not on file  Stress: Not on file  Social Connections: Not on file     Family History: Father with Afib and valvular heart disease Grandfather with history of MI Uncle  with MI  ROS:   Please see the history of present illness.    Review of Systems  Constitutional:  Negative for chills and fever.  HENT:  Negative for hearing loss.   Eyes:  Negative for blurred vision.  Respiratory:  Negative for cough and shortness of breath.   Cardiovascular:  Negative for chest pain, palpitations, orthopnea, claudication, leg swelling and PND.  Gastrointestinal:  Negative for abdominal pain, heartburn, nausea and vomiting.  Genitourinary:  Negative for hematuria.  Musculoskeletal:  Negative for myalgias.  Neurological:  Negative for dizziness and loss of consciousness.  Psychiatric/Behavioral:  Negative for substance abuse.     EKGs/Labs/Other Studies Reviewed:    The following studies were reviewed today: No cardiac studies on file  EKG:  EKG is  ordered today.  The ekg ordered today demonstrates sinus tachycardia with HR 101.  Recent Labs: No results found for requested labs within last 365 days.  Recent Lipid Panel No results found for: "CHOL", "TRIG", "HDL", "CHOLHDL", "VLDL", "LDLCALC", "LDLDIRECT"    Physical Exam:    VS:  There were no vitals taken for this visit.    Wt Readings from Last 3 Encounters:  11/13/19 224 lb (101.6 kg)     GEN:  Well nourished, well developed in no acute distress HEENT: Normal NECK: No JVD; No carotid bruits CARDIAC: RRR,  no murmurs, rubs, gallops RESPIRATORY:  Clear to auscultation without rales, wheezing or rhonchi  ABDOMEN: Soft, non-tender, non-distended MUSCULOSKELETAL:  No edema; No deformity  SKIN: Warm and dry NEUROLOGIC:  Alert and oriented x 3 PSYCHIATRIC:  Normal affect   ASSESSMENT:    No diagnosis found.  PLAN:    In order of problems listed above:  #HLD LDL 152. -Check coronary calcium score -Check NMR, apo lipoprotein B  #HTN: -Continue amlodipine 5mg  daily   Shared Decision Making/Informed Consent     Medication Adjustments/Labs and Tests Ordered: Current medicines are  reviewed at length with the patient today.  Concerns regarding medicines are outlined above.  No orders of the defined types were placed in this encounter.  No orders of the defined types were placed in this encounter.   There are no Patient Instructions on file for this visit.    Signed, , MD  11/30/2021 7:51 PM    [facility]

## 2021-12-02 ENCOUNTER — Ambulatory Visit: Payer: BC Managed Care – PPO | Attending: Cardiology | Admitting: Cardiology

## 2021-12-02 ENCOUNTER — Encounter: Payer: Self-pay | Admitting: Cardiology

## 2021-12-02 VITALS — BP 114/86 | HR 82 | Ht 72.0 in | Wt 224.4 lb

## 2021-12-02 DIAGNOSIS — G4733 Obstructive sleep apnea (adult) (pediatric): Secondary | ICD-10-CM

## 2021-12-02 DIAGNOSIS — E785 Hyperlipidemia, unspecified: Secondary | ICD-10-CM | POA: Diagnosis not present

## 2021-12-02 DIAGNOSIS — Z8279 Family history of other congenital malformations, deformations and chromosomal abnormalities: Secondary | ICD-10-CM

## 2021-12-02 DIAGNOSIS — R011 Cardiac murmur, unspecified: Secondary | ICD-10-CM | POA: Diagnosis not present

## 2021-12-02 DIAGNOSIS — I10 Essential (primary) hypertension: Secondary | ICD-10-CM | POA: Diagnosis not present

## 2021-12-02 NOTE — Progress Notes (Signed)
Cardiology Office Note:    Date:  12/02/2021   ID:  Ricardo Shah, DOB 16-Sep-1980, MRN [id number]  PCP:  Endocrinology, [facility]  [facility] Cardiologist:  None  [facility] Electrophysiologist:  None   Referring MD: [facility]in[facility]    History of Present Illness:    Ricardo Shah is a 40 y.o. male with a hx of depression and positive family history who presents to clinic for follow-up.  Patient was initially seen in 11/2019 for family history of cardiac disease.  His father has Afib and valvular heart disease. Paternal grandfather with MI in 60-70s. Uncle with MI in 63s. He was doing well at that time. We recommended Ca score but this was not performed. LDL 152, HDL 34, TG 143.   Today, the patient states that he is doing well. Since obtaining his CPAP a couple weeks ago he is feeling much better.  His father was recently found to have a leaky tricuspid valve. The diameter is too large for clip placement. Years ago his father had an aortic valve replacement, confirmed by phone call during today's visit. Has history of a bicuspid aortic valve. Reportedly he did not have aortic dilation.   He also presents recent lab work from [employer], which we reviewed in detail. As of 10/01/2021 his results showed: Lipoprotein(a) 27.2, LDL 152, total cholesterol 203, triglycerides 143, potassium 4.2, creatinine 1.08.  For weight loss he is interested in pursuing Rybelsus. We discussed this at length.  He denies any palpitations, chest pain, shortness of breath, or peripheral edema. No lightheadedness, headaches, syncope, orthopnea, or PND.   History reviewed. No pertinent past medical history.  History reviewed. No pertinent surgical history.  Current Medications: Current Meds  Medication Sig   amLODipine (NORVASC) 5 MG tablet Take 1 tablet (5 mg total) by mouth daily.   ARIPiprazole (ABILIFY) 2 MG tablet Take 2 mg by mouth daily.   buPROPion  (WELLBUTRIN XL) 150 MG 24 hr tablet Take 150 mg by mouth at bedtime.   DULoxetine (CYMBALTA) 30 MG capsule Take 30 mg by mouth daily.   DULoxetine (CYMBALTA) 60 MG capsule Take 60 mg by mouth daily.   lamoTRIgine (LAMICTAL) 100 MG tablet Take 200 mg by mouth daily.      Allergies:   Patient has no known allergies.   Social History   Socioeconomic History   Marital status: Single    Spouse name: Not on file   Number of children: Not on file   Years of education: Not on file   Highest education level: Not on file  Occupational History   Not on file  Tobacco Use   Smoking status: Former   Smokeless tobacco: Never  Substance and Sexual Activity   Alcohol use: Yes    Alcohol/week: 3.0 standard drinks of alcohol    Types: 3 Cans of beer per week   Drug use: No   Sexual activity: Not on file  Other Topics Concern   Not on file  Social History Narrative   Not on file   Social Determinants of Health   Financial Resource Strain: Not on file  Food Insecurity: Not on file  Transportation Needs: Not on file  Physical Activity: Not on file  Stress: Not on file  Social Connections: Not on file     Family History: Father with Afib and valvular heart disease Grandfather with history of MI Uncle with MI  ROS:   Please see the history of present illness.  Review of Systems  Constitutional:  Negative for chills and fever.  HENT:  Negative for hearing loss.   Eyes:  Negative for blurred vision.  Respiratory:  Negative for cough and shortness of breath.   Cardiovascular:  Negative for chest pain, palpitations, orthopnea, claudication, leg swelling and PND.  Gastrointestinal:  Negative for abdominal pain, heartburn, nausea and vomiting.  Genitourinary:  Negative for hematuria.  Musculoskeletal:  Negative for myalgias.  Neurological:  Negative for dizziness and loss of consciousness.  Psychiatric/Behavioral:  Negative for substance abuse.     EKGs/Labs/Other Studies Reviewed:     The following studies were reviewed today: No cardiac studies on file  EKG:  EKG is personally reviewed. 12/02/2021:  Sinus rhythm. Rate 82 bpm. 11/13/2019: sinus tachycardia with HR 101.  Recent Labs: No results found for requested labs within last 365 days.   Recent Lipid Panel No results found for: "CHOL", "TRIG", "HDL", "CHOLHDL", "VLDL", "LDLCALC", "LDLDIRECT"    Physical Exam:    VS:  BP 114/86   Pulse 82   Ht 6' (1.829 m)   Wt 224 lb 6.4 oz (101.8 kg)   SpO2 97%   BMI 30.43 kg/m     Wt Readings from Last 3 Encounters:  12/02/21 224 lb 6.4 oz (101.8 kg)  11/13/19 224 lb (101.6 kg)     GEN:  Well nourished, well developed in no acute distress HEENT: Normal NECK: No JVD; No carotid bruits CARDIAC: RRR, no murmurs, rubs, gallops RESPIRATORY:  Clear to auscultation without rales, wheezing or rhonchi  ABDOMEN: Soft, non-tender, non-distended MUSCULOSKELETAL:  No edema; No deformity  SKIN: Warm and dry NEUROLOGIC:  Alert and oriented x 3 PSYCHIATRIC:  Normal affect   ASSESSMENT:    1. Hyperlipidemia, unspecified hyperlipidemia type   2. Murmur   3. Family history of bicuspid aortic valve   4. Primary hypertension   5. OSA (obstructive sleep apnea)     PLAN:    In order of problems listed above:  #HLD LDL 152, Lp(a) 27. Will check Ca score for risk stratification. -Check coronary calcium score  #Family History of Bicuspid Aortic Valve: -Check TTE for screening  #HTN: Well controlled and at goal. -Continue amlodipine 5mg  daily  #Overweight: Patient interested in rebylsus. Will check into coverage.  #OSA: Compliant with CPAP.   Shared Decision Making/Informed Consent    Follow-up:  1 year.  Medication Adjustments/Labs and Tests Ordered: Current medicines are reviewed at length with the patient today.  Concerns regarding medicines are outlined above.   Orders Placed This Encounter  Procedures   CT CARDIAC SCORING (SELF PAY ONLY)   EKG  12-Lead   ECHOCARDIOGRAM COMPLETE   No orders of the defined types were placed in this encounter.  Patient Instructions  Medication Instructions:   Your physician recommends that you continue on your current medications as directed. Please refer to the Current Medication list given to you today.  *If you need a refill on your cardiac medications before your next appointment, please call your pharmacy*   Testing/Procedures:  Your physician has requested that you have an echocardiogram. Echocardiography is a painless test that uses sound waves to create images of your heart. It provides your doctor with information about the size and shape of your heart and how well your heart's chambers and valves are working. This procedure takes approximately one hour. There are no restrictions for this procedure. Please do NOT wear cologne, perfume, aftershave, or lotions (deodorant is allowed). Please arrive 15 minutes prior  to your appointment time.   CARDIAC CALCIUM SCORE (SELF PAY)   Follow-Up: At [facility], you and your health needs are our priority.  As part of our continuing mission to provide you with exceptional heart care, we have created designated Provider Care Teams.  These Care Teams include your primary Cardiologist (physician) and Advanced Practice Providers (APPs -  Physician Assistants and Nurse Practitioners) who all work together to provide you with the care you need, when you need it.  We recommend signing up for the patient portal called "MyChart".  Sign up information is provided on this After Visit Summary.  MyChart is used to connect with patients for Virtual Visits (Telemedicine).  Patients are able to view lab/test results, encounter notes, upcoming appointments, etc.  Non-urgent messages can be sent to your provider as well.   To learn more about what you can do with MyChart, go to [email or web address].    Your next appointment:   1 year(s)  The format for  your next appointment:   In Person  Provider:   DR. Shari Prows   Important Information About Sugar        I,Mathew Stumpf,acting as a scribe for Meriam Sprague, MD.,have documented all relevant documentation on the behalf of Meriam Sprague, MD,as directed by  Meriam Sprague, MD while in the presence of Meriam Sprague, MD.  I, Meriam Sprague, MD, have reviewed all documentation for this visit. The documentation on 12/02/21 for the exam, diagnosis, procedures, and orders are all accurate and complete.    Signed, Meriam Sprague, MD  12/02/2021 5:13 PM    [facility]

## 2021-12-02 NOTE — Patient Instructions (Signed)
Medication Instructions:   Your physician recommends that you continue on your current medications as directed. Please refer to the Current Medication list given to you today.  *If you need a refill on your cardiac medications before your next appointment, please call your pharmacy*    Testing/Procedures:  Your physician has requested that you have an echocardiogram. Echocardiography is a painless test that uses sound waves to create images of your heart. It provides your doctor with information about the size and shape of your heart and how well your heart's chambers and valves are working. This procedure takes approximately one hour. There are no restrictions for this procedure. Please do NOT wear cologne, perfume, aftershave, or lotions (deodorant is allowed). Please arrive 15 minutes prior to your appointment time.    CARDIAC CALCIUM SCORE-(SELF PAY)   Follow-Up: At [facility], you and your health needs are our priority.  As part of our continuing mission to provide you with exceptional heart care, we have created designated Provider Care Teams.  These Care Teams include your primary Cardiologist (physician) and Advanced Practice Providers (APPs -  Physician Assistants and Nurse Practitioners) who all work together to provide you with the care you need, when you need it.  We recommend signing up for the patient portal called "MyChart".  Sign up information is provided on this After Visit Summary.  MyChart is used to connect with patients for Virtual Visits (Telemedicine).  Patients are able to view lab/test results, encounter notes, upcoming appointments, etc.  Non-urgent messages can be sent to your provider as well.   To learn more about what you can do with MyChart, go to [email or web address].mychart.com.    Your next appointment:   1 year(s)  The format for your next appointment:   In Person  Provider:   DR. PEMBERTON   Important Information About Sugar       

## 2021-12-04 ENCOUNTER — Encounter: Payer: Self-pay | Admitting: Cardiology

## 2021-12-22 ENCOUNTER — Ambulatory Visit (HOSPITAL_COMMUNITY): Payer: BC Managed Care – PPO | Attending: Cardiology

## 2021-12-22 DIAGNOSIS — R011 Cardiac murmur, unspecified: Secondary | ICD-10-CM | POA: Insufficient documentation

## 2021-12-22 LAB — ECHOCARDIOGRAM COMPLETE
Area-P 1/2: 2.32 cm2
S' Lateral: 2.7 cm

## 2021-12-23 ENCOUNTER — Telehealth: Payer: Self-pay | Admitting: *Deleted

## 2021-12-23 MED ORDER — AMLODIPINE BESYLATE 5 MG PO TABS: 5.0000 mg | ORAL_TABLET | Freq: Every day | ORAL | 2 refills | Status: DC

## 2021-12-23 NOTE — Telephone Encounter (Signed)
-----   Message from Meriam Sprague, MD sent at 12/23/2021  2:31 PM EST ----- His echo looks great with normal pumping function. His aortic valve is normal in structure so he does not have the bicuspid valve like his father. His heart was called moderately thickened in one area but on my review, it is mildly thickened and will not cause symptoms. We just need to ensure his blood pressure is well controlled going forward.

## 2021-12-23 NOTE — Telephone Encounter (Signed)
The patient has been notified of the result and verbalized understanding.  All questions (if any) were answered. Loa Socks, LPN [id number] 1:93 PM    Off note: Pt is requesting a refill of his amlodipine.  Confirmed the pharmacy of choice with the pt.  Refill request sent in for the pt.  Pt verbalized understanding and agrees with this plan.

## 2022-01-11 ENCOUNTER — Other Ambulatory Visit: Payer: Self-pay | Admitting: Physician Assistant

## 2022-01-11 MED ORDER — AMOXICILLIN 500 MG PO TABS: 500.0000 mg | ORAL_TABLET | Freq: Two times a day (BID) | ORAL | 0 refills | Status: DC

## 2022-02-02 ENCOUNTER — Encounter (HOSPITAL_BASED_OUTPATIENT_CLINIC_OR_DEPARTMENT_OTHER): Payer: Self-pay

## 2022-02-02 ENCOUNTER — Ambulatory Visit (HOSPITAL_BASED_OUTPATIENT_CLINIC_OR_DEPARTMENT_OTHER)
Admission: RE | Admit: 2022-02-02 | Discharge: 2022-02-02 | Disposition: A | Discharge status: Home / Self Care | Payer: BC Managed Care – PPO | Source: Ambulatory Visit | Attending: Cardiology | Admitting: Cardiology

## 2022-02-02 DIAGNOSIS — E785 Hyperlipidemia, unspecified: Secondary | ICD-10-CM | POA: Insufficient documentation

## 2022-02-12 ENCOUNTER — Encounter: Payer: Self-pay | Admitting: Cardiology

## 2022-04-20 ENCOUNTER — Encounter: Payer: Self-pay | Admitting: Cardiology

## 2022-04-23 MED ORDER — WEGOVY 0.25 MG/0.5ML ~~LOC~~ SOAJ: 0.2500 mg | SUBCUTANEOUS | 0 refills | Status: DC

## 2022-04-28 ENCOUNTER — Encounter: Payer: Self-pay | Admitting: Student

## 2022-04-28 ENCOUNTER — Ambulatory Visit: Payer: BC Managed Care – PPO | Attending: Interventional Cardiology | Admitting: Student

## 2022-04-28 VITALS — Ht 72.0 in | Wt 222.0 lb

## 2022-04-28 DIAGNOSIS — E66811 Obesity, class 1: Secondary | ICD-10-CM

## 2022-04-28 DIAGNOSIS — E669 Obesity, unspecified: Secondary | ICD-10-CM | POA: Diagnosis not present

## 2022-04-28 NOTE — Patient Instructions (Signed)
GLP1 Agonist Titration Plan:  Will plan to follow the titration plan as below, pending patient is tolerating each dose before increasing to the next. Can slow titration if needed for tolerability.   Wegovy:  -Month 1: Inject Wegovy 0.25 mg once weekly x 4 weeks -Month 2: Inject Wegovy 0.5 mg once weekly x 4 weeks -Month 3: Inject Wegovy 1 mg once weekly x 4 weeks -Month 4: Inject [id number] 1.7mg  SQ once weekly x 4 weeks -Month 5+: Inject Wegovy 2.4mg  SQ once weekly

## 2022-04-28 NOTE — Progress Notes (Unsigned)
   HPI: Ricardo Shah is a 42 y.o. male patient referred to pharmacy clinic by Dr. Shari Prows to initiate weight loss therapy with GLP1-RA.  Most recent BMI 30. PMH significant for OSA, HTN, HDL.  Patient presented today for GLP 1 education. He already took his first dose of Wegovy. He has been improving his dietary habits. He still eats out but make healthy choices eg. Instead of eating processed food he select to eat salads with grilled meat. He has not cut down on dressings but making slow persistent changes over time. Not been doing any exercise; thinking to start playing tennis again.   Current weight management medications: Wegovy 0.25 mg once week   Previously tried meds: rybelsus - low dose   Current meds that may affect weight: one   Baseline weight/BMI: 222lbs/30.11   Diet:  Lots of greens and chicken  Eats out but cut down on processed food  Late night snacks - use to be cereals, cake or cookie - but now he eats  Celary and peanut butter, low salt nuts, oranges Drinks: water, diet coke sometime   Exercise: none   Family History:  Confirmed patient has no personal or family history of medullary thyroid carcinoma (MTC) or Multiple Endocrine Neoplasia syndrome type 2 (MEN 2).   Social History:  Alcohol: occasional  Smoking: none   Labs: No results found for: "HGBA1C"  Wt Readings from Last 1 Encounters:  12/02/21 224 lb 6.4 oz (101.8 kg)    BP Readings from Last 1 Encounters:  12/02/21 114/86   Pulse Readings from Last 1 Encounters:  12/02/21 82    No results found for: "CHOL", "TRIG", "HDL", "CHOLHDL", "VLDL", "LDLCALC", "LDLDIRECT"  No past medical history on file.  Current Outpatient Medications on File Prior to Visit  Medication Sig Dispense Refill   amoxicillin (AMOXIL) 500 MG tablet Take 1 tablet (500 mg total) by mouth 2 (two) times daily. 20 tablet 0   amLODipine (NORVASC) 5 MG tablet Take 1 tablet (5 mg total) by mouth daily. 90 tablet  2   ARIPiprazole (ABILIFY) 2 MG tablet Take 2 mg by mouth daily.     buPROPion (WELLBUTRIN XL) 150 MG 24 hr tablet Take 150 mg by mouth at bedtime.     DULoxetine (CYMBALTA) 30 MG capsule Take 30 mg by mouth daily.     DULoxetine (CYMBALTA) 60 MG capsule Take 60 mg by mouth daily.     lamoTRIgine (LAMICTAL) 100 MG tablet Take 200 mg by mouth daily.      WEGOVY 0.25 MG/0.5ML SOAJ Inject 0.25 mg into the skin once a week. 2 mL 0   No current facility-administered medications on file prior to visit.    No Known Allergies  Assessment/Plan  Patient has tolerated the 1st dose of Wegovy well, has not noticed any side effects We discussed  significance of regular exercise and adopting healthy eating habit  We discussed the dose titration schedule and potential GI related s/e of Wegovy and its management  (consume less carb dense and less fat dense small meals)   Patient to send [place] MyChart before using last dose of current dose so we can titrate to weight effective dose without therapy interruption  Pharmacist to follow up in 3-4 weeks    Carmela Hurt, Pharm.D [facility] A Division of [facility] [address] [place], [place] [address]  Phone: [phone]; Fax: [phone]

## 2022-05-06 ENCOUNTER — Encounter: Payer: Self-pay | Admitting: Cardiology

## 2022-05-08 MED ORDER — WEGOVY 0.5 MG/0.5ML ~~LOC~~ SOAJ: 0.5000 mg | SUBCUTANEOUS | 0 refills | Status: DC

## 2022-05-27 MED ORDER — WEGOVY 0.5 MG/0.5ML ~~LOC~~ SOAJ: 0.5000 mg | SUBCUTANEOUS | 5 refills | Status: DC

## 2022-06-03 ENCOUNTER — Ambulatory Visit (INDEPENDENT_AMBULATORY_CARE_PROVIDER_SITE_OTHER): Payer: BC Managed Care – PPO | Admitting: Internal Medicine

## 2022-06-03 ENCOUNTER — Encounter: Payer: Self-pay | Admitting: Internal Medicine

## 2022-06-03 VITALS — BP 118/84 | HR 79 | Temp 98.6°F | Ht 72.0 in | Wt 219.0 lb

## 2022-06-03 DIAGNOSIS — I10 Essential (primary) hypertension: Secondary | ICD-10-CM

## 2022-06-03 DIAGNOSIS — Z6829 Body mass index (BMI) 29.0-29.9, adult: Secondary | ICD-10-CM | POA: Diagnosis not present

## 2022-06-03 DIAGNOSIS — F39 Unspecified mood [affective] disorder: Secondary | ICD-10-CM | POA: Diagnosis not present

## 2022-06-03 DIAGNOSIS — E663 Overweight: Secondary | ICD-10-CM | POA: Diagnosis not present

## 2022-06-03 MED ORDER — LAMOTRIGINE 100 MG PO TABS: 200.0000 mg | ORAL_TABLET | Freq: Every day | ORAL | 3 refills | Status: DC

## 2022-06-03 MED ORDER — DULOXETINE HCL 60 MG PO CPEP: 60.0000 mg | ORAL_CAPSULE | Freq: Every day | ORAL | 3 refills | Status: DC

## 2022-06-03 NOTE — Progress Notes (Signed)
   Subjective:   Patient ID: Ricardo Shah, male    DOB: 26-Oct-1980, 42 y.o.   MRN: [id number]  HPI The patient is a new 42 YO man coming in for ongoing care and wishes to get mental health care from [place] as stable for some time on medications.   PMH, [facility], social history reviewed and updated  Review of Systems  Constitutional: Negative.   HENT: Negative.    Eyes: Negative.   Respiratory:  Negative for cough, chest tightness and shortness of breath.   Cardiovascular:  Negative for chest pain, palpitations and leg swelling.  Gastrointestinal:  Negative for abdominal distention, abdominal pain, constipation, diarrhea, nausea and vomiting.  Musculoskeletal: Negative.   Skin: Negative.   Neurological: Negative.   Psychiatric/Behavioral: Negative.      Objective:  Physical Exam Constitutional:      Appearance: He is well-developed.  HENT:     Head: Normocephalic and atraumatic.  Cardiovascular:     Rate and Rhythm: Normal rate and regular rhythm.  Pulmonary:     Effort: Pulmonary effort is normal. No respiratory distress.     Breath sounds: Normal breath sounds. No wheezing or rales.  Abdominal:     General: Bowel sounds are normal. There is no distension.     Palpations: Abdomen is soft.     Tenderness: There is no abdominal tenderness. There is no rebound.  Musculoskeletal:     Cervical back: Normal range of motion.  Skin:    General: Skin is warm and dry.  Neurological:     Mental Status: He is alert and oriented to person, place, and time.     Coordination: Coordination normal.     Vitals:   06/03/22 0850  BP: 118/84  Pulse: 79  Temp: 98.6 F (37 C)  TempSrc: Oral  SpO2: 96%  Weight: 219 lb (99.3 kg)  Height: 6' (1.829 m)    Assessment & Plan:

## 2022-06-03 NOTE — Patient Instructions (Addendum)
We will send in the medicines so they are ready when you need them.

## 2022-06-05 ENCOUNTER — Encounter: Payer: Self-pay | Admitting: Internal Medicine

## 2022-06-05 DIAGNOSIS — I10 Essential (primary) hypertension: Secondary | ICD-10-CM | POA: Insufficient documentation

## 2022-06-05 DIAGNOSIS — E663 Overweight: Secondary | ICD-10-CM | POA: Insufficient documentation

## 2022-06-05 DIAGNOSIS — F39 Unspecified mood [affective] disorder: Secondary | ICD-10-CM | POA: Insufficient documentation

## 2022-06-05 NOTE — Assessment & Plan Note (Signed)
Taking amlodipine 5 mg daily and working on weight loss. He would like to try to wean off medications. Follow up Sept when physical due and assess BP then.

## 2022-06-05 NOTE — Assessment & Plan Note (Signed)
Was obese at onset of wegovy and approved for titration. He is already starting to notice some weight loss, acceptable side effects and would like to continue.

## 2022-06-05 NOTE — Assessment & Plan Note (Signed)
Has done well with lamictal 200 mg daily and cymbalta 60 mg daily. Will take over care and new rx done for these.

## 2022-06-08 MED ORDER — WEGOVY 1 MG/0.5ML ~~LOC~~ SOAJ: 1.0000 mg | SUBCUTANEOUS | 0 refills | Status: DC

## 2022-06-15 ENCOUNTER — Encounter: Payer: Self-pay | Admitting: Internal Medicine

## 2022-06-19 MED ORDER — WEGOVY 1 MG/0.5ML ~~LOC~~ SOAJ: 1.0000 mg | SUBCUTANEOUS | 0 refills | Status: DC

## 2022-07-15 ENCOUNTER — Other Ambulatory Visit: Payer: Self-pay | Admitting: Pharmacist

## 2022-07-15 MED ORDER — WEGOVY 1 MG/0.5ML ~~LOC~~ SOAJ: 1.0000 mg | SUBCUTANEOUS | 5 refills | Status: DC

## 2022-08-17 ENCOUNTER — Encounter: Payer: Self-pay | Admitting: Cardiology

## 2022-08-18 MED ORDER — WEGOVY 1.7 MG/0.75ML ~~LOC~~ SOAJ: 1.7000 mg | SUBCUTANEOUS | 0 refills | Status: DC

## 2022-08-26 ENCOUNTER — Other Ambulatory Visit: Payer: Self-pay | Admitting: Pharmacist

## 2022-08-26 MED ORDER — WEGOVY 1 MG/0.5ML ~~LOC~~ SOAJ: 1.0000 mg | SUBCUTANEOUS | 11 refills | Status: DC

## 2022-08-26 MED ORDER — WEGOVY 1 MG/0.5ML ~~LOC~~ SOAJ: 1.0000 mg | SUBCUTANEOUS | 5 refills | Status: DC

## 2022-09-14 ENCOUNTER — Ambulatory Visit (INDEPENDENT_AMBULATORY_CARE_PROVIDER_SITE_OTHER): Payer: BC Managed Care – PPO | Admitting: Family Medicine

## 2022-09-14 ENCOUNTER — Encounter: Payer: Self-pay | Admitting: Family Medicine

## 2022-09-14 ENCOUNTER — Other Ambulatory Visit: Payer: Self-pay

## 2022-09-14 VITALS — BP 110/80 | HR 97 | Ht 72.0 in | Wt 198.6 lb

## 2022-09-14 DIAGNOSIS — M25551 Pain in right hip: Secondary | ICD-10-CM | POA: Diagnosis not present

## 2022-09-14 NOTE — Progress Notes (Signed)
   I, Ricardo Shah, CMA acting as a scribe for Ricardo Graham, MD.  Ricardo Shah is a 42 y.o. male who presents to [facility] at [facility] today for R hip pain x  week. 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 Pt locates pain to lateral aspect. Describes as more numbness than pain. Sharp pain with prolonged time sitting or laying down.   Radiates: no Aggravates: prolonged sitting/laying Treatments tried: rest  Pertinent review of systems: No fevers or chills  Relevant historical information: Otherwise healthy.   Exam:  BP 110/80   Pulse 97   Ht 6' (1.829 m)   Wt 198 lb 9.6 oz (90.1 kg)   SpO2 (!) 59%   BMI 26.94 kg/m  General: Well Developed, well nourished, and in no acute distress.   MSK: Right hip normal-appearing Normal motion. Tender palpation at greater trochanter. Hip abduction and external rotation strength are mildly diminished 4/5 and do reproduce pain. Unable to reproduce or worsen numbness with pressure over anterior hip.    Assessment and Plan: 42 y.o. 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  He does have some numbness which I cannot explain with this diagnosis.  Is possible he irritated a small peripheral nerve in the area.  I do not think he has meralgia paresthetica.  Plan for home exercise program taught in clinic for today by ATC.  If not improved consider formal physical therapy.  Recheck back as needed.   PDMP not reviewed this encounter. No orders of the defined types were placed in this encounter.  No orders of the defined types were placed in this encounter.    Discussed warning signs or symptoms. Please see discharge instructions. Patient expresses understanding.   The above documentation has been reviewed and is accurate and complete Ricardo Shah, M.D.

## 2022-09-14 NOTE — Patient Instructions (Addendum)
Thank you for coming in today.   Please complete the exercises that the athletic trainer went over with you:  View at [email or web address] using code: [email or web address]  Trochanteric Bursitis.   If not better let me know and we can order PT.

## 2022-09-15 ENCOUNTER — Other Ambulatory Visit: Payer: Self-pay

## 2022-09-15 ENCOUNTER — Emergency Department (HOSPITAL_BASED_OUTPATIENT_CLINIC_OR_DEPARTMENT_OTHER): Payer: BC Managed Care – PPO | Admitting: Radiology

## 2022-09-15 ENCOUNTER — Encounter (HOSPITAL_BASED_OUTPATIENT_CLINIC_OR_DEPARTMENT_OTHER): Payer: Self-pay | Admitting: Emergency Medicine

## 2022-09-15 ENCOUNTER — Other Ambulatory Visit (HOSPITAL_BASED_OUTPATIENT_CLINIC_OR_DEPARTMENT_OTHER): Payer: Self-pay

## 2022-09-15 ENCOUNTER — Emergency Department (HOSPITAL_BASED_OUTPATIENT_CLINIC_OR_DEPARTMENT_OTHER)
Admission: EM | Admit: 2022-09-15 | Discharge: 2022-09-15 | Disposition: A | Discharge status: Home / Self Care | Payer: BC Managed Care – PPO | Attending: Emergency Medicine | Admitting: Emergency Medicine

## 2022-09-15 DIAGNOSIS — I10 Essential (primary) hypertension: Secondary | ICD-10-CM | POA: Diagnosis not present

## 2022-09-15 DIAGNOSIS — R0602 Shortness of breath: Secondary | ICD-10-CM | POA: Diagnosis present

## 2022-09-15 DIAGNOSIS — Z79899 Other long term (current) drug therapy: Secondary | ICD-10-CM | POA: Diagnosis not present

## 2022-09-15 DIAGNOSIS — R0600 Dyspnea, unspecified: Secondary | ICD-10-CM | POA: Insufficient documentation

## 2022-09-15 LAB — CBC
HCT: 42.5 % (ref 39.0–52.0)
Hemoglobin: 14.7 g/dL (ref 13.0–17.0)
MCH: 29.9 pg (ref 26.0–34.0)
MCHC: 34.6 g/dL (ref 30.0–36.0)
MCV: 86.4 fL (ref 80.0–100.0)
Platelets: 292 10*3/uL (ref 150–400)
RBC: 4.92 MIL/uL (ref 4.22–5.81)
RDW: 11.9 % (ref 11.5–15.5)
WBC: 3.7 10*3/uL — ABNORMAL LOW (ref 4.0–10.5)
nRBC: 0 % (ref 0.0–0.2)

## 2022-09-15 LAB — COMPREHENSIVE METABOLIC PANEL
ALT: 25 U/L (ref 0–44)
AST: 26 U/L (ref 15–41)
Albumin: 4.6 g/dL (ref 3.5–5.0)
Alkaline Phosphatase: 82 U/L (ref 38–126)
Anion gap: 6 (ref 5–15)
BUN: 10 mg/dL (ref 6–20)
CO2: 29 mmol/L (ref 22–32)
Calcium: 9.6 mg/dL (ref 8.9–10.3)
Chloride: 103 mmol/L (ref 98–111)
Creatinine, Ser: 0.96 mg/dL (ref 0.61–1.24)
GFR, Estimated: >60 mL/min (ref >60)
Glucose, Bld: 85 mg/dL (ref 70–99)
Potassium: 4.3 mmol/L (ref 3.5–5.1)
Sodium: 138 mmol/L (ref 135–145)
Total Bilirubin: 0.6 mg/dL (ref 0.3–1.2)
Total Protein: 7.3 g/dL (ref 6.5–8.1)

## 2022-09-15 NOTE — ED Triage Notes (Signed)
Pt arrives to ED with c/o SOB that started a few days ago. He notes SOB comes when's he's at rest. He notes kid at COVID last week. Hx anxiety. No pain/cough.

## 2022-09-15 NOTE — ED Provider Notes (Signed)
[place] EMERGENCY DEPARTMENT AT [facility] Provider Note   CSN: [id number] Arrival date & time: 09/15/22  4098     History  Chief Complaint  Patient presents with   Shortness of Breath    Ricardo Shah is a 42 y.o. male.   Shortness of Breath  Patient is a 42 year old male with a past medical history significant for obesity that is in remission after GLP-1 therapy and history of hypertension but no longer with hypertension after treatment of obesity.  On no medications currently.  States he does have a history of anxiety  He states that he is somewhat stressed and has been recently because he is buying a house and has other stressors going on his life.  He feels safe at home is not considering suicide or severely depressed.  He states that he has been noticing some intermittent episodes of shortness of breath that he describes as feeling like he needs to take deeper breaths this occurs only at rest when he is sitting at his desk.  He denies any chest pain no hemoptysis no history of VTE, no recent hospitalization or immobilization.  No recent long travel.  No hypercoagulable diseases in his family.  He is a non-smoker no history of asthma or COPD.  He is asymptomatic currently feels well and states that he is here primarily just to be checked out.      Home Medications Prior to Admission medications   Medication Sig Start Date End Date Taking? Authorizing Provider  WEGOVY 1.7 MG/0.75ML SOAJ Inject 1.7 mg into the skin once a week. 09/04/22  Yes [provider]  amLODipine (NORVASC) 5 MG tablet Take 1 tablet (5 mg total) by mouth daily. 12/23/21   Meriam Sprague, MD  DULoxetine (CYMBALTA) 60 MG capsule Take 1 capsule (60 mg total) by mouth daily. 06/03/22   Myrlene Broker, MD  lamoTRIgine (LAMICTAL) 100 MG tablet Take 2 tablets (200 mg total) by mouth daily. 06/03/22   Myrlene Broker, MD  WEGOVY 1 MG/0.5ML SOAJ Inject 1 mg into the  skin once a week. 08/26/22   Supple, Emeline Darling, RPH-CPP      Allergies    Patient has no known allergies.    Review of Systems   Review of Systems  Respiratory:  Positive for shortness of breath.     Physical Exam Updated Vital Signs BP 114/84   Pulse 74   Temp 97.8 F (36.6 C) (Oral)   Resp 12   SpO2 99%  Physical Exam Vitals and nursing note reviewed.  Constitutional:      General: He is not in acute distress. HENT:     Head: Normocephalic and atraumatic.     Nose: Nose normal.     Mouth/Throat:     Mouth: Mucous membranes are moist.  Eyes:     General: No scleral icterus. Cardiovascular:     Rate and Rhythm: Normal rate and regular rhythm.     Pulses: Normal pulses.     Heart sounds: Normal heart sounds.  Pulmonary:     Effort: Pulmonary effort is normal. No respiratory distress.     Breath sounds: Normal breath sounds. No wheezing.     Comments: Lungs clear, speaking in full, lengthy sentences.  Ambulates without dyspnea Abdominal:     Palpations: Abdomen is soft.     Tenderness: There is no abdominal tenderness. There is no guarding or rebound.  Musculoskeletal:     Cervical back: Normal range of motion.  Right lower leg: No edema.     Left lower leg: No edema.  Skin:    General: Skin is warm and dry.     Capillary Refill: Capillary refill takes less than 2 seconds.  Neurological:     Mental Status: He is alert. Mental status is at baseline.  Psychiatric:        Mood and Affect: Mood normal.        Behavior: Behavior normal.     ED Results / Procedures / Treatments   Labs (all labs ordered are listed, but only abnormal results are displayed) Labs Reviewed  CBC - Abnormal; Notable for the following components:      Result Value   WBC 3.7 (*)    All other components within normal limits  COMPREHENSIVE METABOLIC PANEL    EKG EKG Interpretation Date/Time:  Tuesday September 15 2022 09:35:31 EDT Ventricular Rate:  73 PR Interval:  161 QRS  Duration:  104 QT Interval:  373 QTC Calculation: 411 R Axis:   98  Text Interpretation: Sinus rhythm Borderline right axis deviation ST elev, probable normal early repol pattern No previous tracing Confirmed by Gwyneth Sprout ([id number]) on 09/15/2022 10:28:05 AM  Radiology No results found.  Procedures Procedures    Medications Ordered in ED Medications - No data to display  ED Course/ Medical Decision Making/ A&P                                 Medical Decision Making Amount and/or Complexity of Data Reviewed Labs: ordered. Radiology: ordered.   Patient is a 42 year old male with a past medical history significant for obesity that is in remission after GLP-1 therapy and history of hypertension but no longer with hypertension after treatment of obesity.  On no medications currently.  States he does have a history of anxiety  He states that he is somewhat stressed and has been recently because he is buying a house and has other stressors going on his life.  He feels safe at home is not considering suicide or severely depressed.  He states that he has been noticing some intermittent episodes of shortness of breath that he describes as feeling like he needs to take deeper breaths this occurs only at rest when he is sitting at his desk.  He denies any chest pain no hemoptysis no history of VTE, no recent hospitalization or immobilization.  No recent long travel.  No hypercoagulable diseases in his family.  He is a non-smoker no history of asthma or COPD.  He is asymptomatic currently feels well and states that he is here primarily just to be checked out.  Physical exam unremarkable, patient with clear lung sounds, normal vital signs, abdomen is soft nontender and patient does not appear dyspneic, ambulated without any hypoxia.  PERC negative and I have low suspicion for PE.  CBC without anemia EKG generally unremarkable no acute ST elevation concerning for MI patient is having no chest  pain I do not believe troponins are necessary in this case.  Chest x-ray personally viewed by me agree with radiology read no infiltrate or acute abnormal finding.  Mild leukopenia which is similar to prior over the past few years he has had WBC at approximately 4.  Today 3.7.  No anemia, CMP unremarkable  I do lengthy discussion with patient about his symptoms and about possible etiologies.  He feels comfortable going home at this time.  Will discharge home with recommendations to follow-up with primary care.  There very well could be an anxiety component to his symptoms.  Given that dyspnea is a concerning symptom however he was given strict return precautions to the emergency room and we will closely monitor his symptoms.  He will take all of his prescribed indications as prescribed apart from his amlodipine as he is not hypertensive any longer--and has been off this medication for over a month.  Final Clinical Impression(s) / ED Diagnoses Final diagnoses:  Dyspnea, unspecified type    Rx / DC Orders ED Discharge Orders     None         Gailen Shelter, [place] 09/15/22 1218    Gwyneth Sprout, MD 09/16/22 1335

## 2022-09-15 NOTE — ED Notes (Signed)
Discharge paperwork given and verbally understood. 

## 2022-09-15 NOTE — Discharge Instructions (Signed)
Monitor your symptoms and return to the emergency room for any new or concerning symptoms such as coughing up blood, leg swelling, chest pain or any other new or concerning symptoms.  Otherwise please follow-up with your primary care doctor.  I have printed you a copy of your EKG for your records. Square breathing (as we discussed) or listen to meditation podcast or similar can be helpful when you are experiencing symptoms and this can be helpful as a diagnostic test to see if this helps with your symptoms.

## 2022-09-15 NOTE — ED Notes (Signed)
Pt ambulated -- Pts SpO2 stayed in the 97% range and HR stayed around 110...  Pt complained of no dizziness/SOB or any other aliment while ambulating... Provider notified.Marland KitchenMarland Kitchen

## 2022-09-16 ENCOUNTER — Other Ambulatory Visit (HOSPITAL_COMMUNITY): Payer: Self-pay

## 2022-09-16 ENCOUNTER — Telehealth: Payer: Self-pay | Admitting: Cardiology

## 2022-09-16 NOTE — Telephone Encounter (Signed)
Is there a specific medication?

## 2022-09-16 NOTE — Telephone Encounter (Signed)
Patient was calling to speak with you. Please advise

## 2022-09-16 NOTE — Telephone Encounter (Signed)
Pt states updated prior Berkley Harvey is needed, pt currently taking 1mg  and wishes to stay on that dose as he feels the 1.7mg  is too much.

## 2022-09-16 NOTE — Telephone Encounter (Signed)
Yes, Wegovy 1mg  once weekly.

## 2022-09-17 ENCOUNTER — Telehealth: Payer: Self-pay | Admitting: Pharmacy Technician

## 2022-09-17 ENCOUNTER — Other Ambulatory Visit (HOSPITAL_COMMUNITY): Payer: Self-pay

## 2022-09-17 NOTE — Telephone Encounter (Signed)
PA request has been Submitted. New Encounter created for follow up. For additional info see Pharmacy Prior Auth telephone encounter from 09/17/22.

## 2022-09-17 NOTE — Telephone Encounter (Signed)
Pharmacy Patient Advocate Encounter   Received notification from Pt Calls Messages that prior authorization for wegovy is required/requested.   Insurance verification completed.   The patient is insured through [employer] [facility] .   Per test claim: PA required; [place] to [employer] via CoverMyMeds Key/confirmation #/EOC [id number] Status is pending

## 2022-09-17 NOTE — Telephone Encounter (Signed)
Pharmacy Patient Advocate Encounter   Received notification from Pt Calls Messages that prior authorization for wegovy is required/requested.   Insurance verification completed.   The patient is insured through [facility] .   Per test claim: PA required; PA started via CoverMyMeds. KEY [id number] . Waiting for clinical questions to populate.

## 2022-09-18 ENCOUNTER — Other Ambulatory Visit (HOSPITAL_COMMUNITY): Payer: Self-pay

## 2022-09-18 NOTE — Telephone Encounter (Signed)
Pharmacy Patient Advocate Encounter  Received notification from [facility] that Prior Authorization for wegovy has been APPROVED from 09/17/22 to 09/17/23. Ran test claim, Copay is $1321.96 (Appears to have deductible) . This test claim was processed through [facility]- copay amounts may vary at other pharmacies due to pharmacy/plan contracts, or as the patient moves through the different stages of their insurance plan.   PA #/Case ID/Reference #: [id number]

## 2022-09-18 NOTE — Telephone Encounter (Signed)
Pt notified of PA approval.

## 2022-10-06 ENCOUNTER — Ambulatory Visit: Payer: BC Managed Care – PPO | Admitting: Internal Medicine

## 2022-10-15 ENCOUNTER — Ambulatory Visit: Payer: BC Managed Care – PPO | Admitting: Internal Medicine

## 2022-10-20 ENCOUNTER — Ambulatory Visit: Payer: BC Managed Care – PPO | Admitting: Internal Medicine

## 2023-01-04 NOTE — Progress Notes (Signed)
   LILLETTE Ileana Collet, PhD, LAT, ATC acting as a scribe for Artist Lloyd, MD.  Ricardo Shah is a 42 y.o. male who presents to [facility] at [facility] today for cont'd R hip pain w/ numbness. Pt was last seen by Dr. Lloyd on 09/14/22 and was taught HEP.   Today, pt reports R hip pain continues. Pt locates pain to the lateral portion of her R buttock w/ numbness to the touch. He is worried about the possibility of cancer. No LBP.  Pertinent review of systems: No fevers or chills  Relevant historical information: Hypertension   Exam:  BP (!) 148/86 (Cuff Size: Normal)   Pulse 77   Ht 6' (1.829 m)   Wt 211 lb (95.7 kg)   SpO2 96%   BMI 28.62 kg/m  General: Well Developed, well nourished, and in no acute distress.   MSK: Right hip normal-appearing Mildly tender palpation greater trochanter. Normal hip motion.   Hip abduction and external rotation strength are mildly diminished 4/5.    Lab and Radiology Results  X-ray images right hip obtained today personally and independently interpreted. No aggressive appearing bony lesions.  No acute fractures.  No severe arthritis. Await formal radiology review    Assessment and Plan: 42 y.o. male with right lateral hip pain/numbness.  Differential includes hip abductor tendinopathy secondary to weakness and meralgia paresthetica.  Plan for home exercise program and a bit of watchful waiting. X-ray today is reassuringly normal.  Radiology overread is still pending. Chronic problem with recurrence. PDMP not reviewed this encounter. Orders Placed This Encounter  Procedures   DG HIP UNILAT W OR W/O PELVIS 2-3 VIEWS RIGHT    Standing Status:   Future    Number of Occurrences:   1    Expiration Date:   02/05/2023    Reason for Exam (SYMPTOM  OR DIAGNOSIS REQUIRED):   right hip pain    Preferred imaging location?:   [facility]   US  LIMITED JOINT SPACE STRUCTURES LOW RIGHT(NO LINKED CHARGES)    Reason for  Exam (SYMPTOM  OR DIAGNOSIS REQUIRED):   right hip pain    Preferred imaging location?:   [facility]   No orders of the defined types were placed in this encounter.    Discussed warning signs or symptoms. Please see discharge instructions. Patient expresses understanding.   The above documentation has been reviewed and is accurate and complete Artist Lloyd, M.D.

## 2023-01-05 ENCOUNTER — Ambulatory Visit (INDEPENDENT_AMBULATORY_CARE_PROVIDER_SITE_OTHER): Payer: BC Managed Care – PPO | Admitting: Family Medicine

## 2023-01-05 ENCOUNTER — Ambulatory Visit (INDEPENDENT_AMBULATORY_CARE_PROVIDER_SITE_OTHER): Payer: BC Managed Care – PPO

## 2023-01-05 ENCOUNTER — Other Ambulatory Visit: Payer: Self-pay

## 2023-01-05 VITALS — BP 148/86 | HR 77 | Ht 72.0 in | Wt 211.0 lb

## 2023-01-05 DIAGNOSIS — M25551 Pain in right hip: Secondary | ICD-10-CM | POA: Diagnosis not present

## 2023-01-05 NOTE — Patient Instructions (Addendum)
 Thank you for coming in today.   Please get an Xray today before you leave   Keep working on hip abduction strength for greater trochanteric bursitis.  We can add formal PT for this is needed.   Please use Voltaren gel (Generic Diclofenac Gel) up to 4x daily for pain as needed.  This is available over-the-counter as both the name brand Voltaren gel and the generic diclofenac gel.

## 2023-01-13 ENCOUNTER — Encounter: Payer: Self-pay | Admitting: Family Medicine

## 2023-01-18 NOTE — Progress Notes (Signed)
 Right hip x-ray looks normal to radiology

## 2023-01-25 ENCOUNTER — Encounter: Payer: Self-pay | Admitting: Family Medicine

## 2023-01-25 ENCOUNTER — Ambulatory Visit: Payer: Self-pay | Admitting: Internal Medicine

## 2023-01-25 ENCOUNTER — Encounter: Payer: Self-pay | Admitting: Internal Medicine

## 2023-01-25 ENCOUNTER — Telehealth: Payer: BC Managed Care – PPO | Admitting: Nurse Practitioner

## 2023-01-25 DIAGNOSIS — M545 Low back pain, unspecified: Secondary | ICD-10-CM | POA: Diagnosis not present

## 2023-01-25 MED ORDER — BACLOFEN 10 MG PO TABS
10.0000 mg | ORAL_TABLET | Freq: Three times a day (TID) | ORAL | 0 refills | Status: DC
Start: 2023-01-25 — End: 2023-08-10

## 2023-01-25 NOTE — Progress Notes (Signed)
Virtual Visit Consent   Ricardo Shah, you are scheduled for a virtual visit with a [facility] provider today. Just as with appointments in the office, your consent must be obtained to participate. Your consent will be active for this visit and any virtual visit you may have with one of our providers in the next 365 days. If you have a MyChart account, a copy of this consent can be sent to you electronically.  As this is a virtual visit, video technology does not allow for your provider to perform a traditional examination. This may limit your provider's ability to fully assess your condition. If your provider identifies any concerns that need to be evaluated in person or the need to arrange testing (such as labs, EKG, etc.), we will make arrangements to do so. Although advances in technology are sophisticated, we cannot ensure that it will always work on either your end or our end. If the connection with a video visit is poor, the visit may have to be switched to a telephone visit. With either a video or telephone visit, we are not always able to ensure that we have a secure connection.  By engaging in this virtual visit, you consent to the provision of healthcare and authorize for your insurance to be billed (if applicable) for the services provided during this visit. Depending on your insurance coverage, you may receive a charge related to this service.  I need to obtain your verbal consent now. Are you willing to proceed with your visit today? Ricardo Shah has provided verbal consent on 01/25/2023 for a virtual visit (video or telephone). Viviano Simas, FNP  Date: 01/25/2023 5:01 PM  Virtual Visit via Video Note   I, Viviano Simas, [name]  ([id number], [birth year]) on 01/25/23 at  5:00 PM EST by a video-enabled telemedicine application and verified that I am speaking with the correct person using two identifiers.  Location: Patient: Virtual Visit  Location Patient: Home Provider: Virtual Visit Location Provider: Home Office   I discussed the limitations of evaluation and management by telemedicine and the availability of in person appointments. The patient expressed understanding and agreed to proceed.    History of Present Illness: Ricardo Shah is a 43 y.o. who identifies as a male who was assigned male at birth, and is being seen today for back pain.  Lower left back pain that feels tight  Worse when bending over or lifting the leg He feels like it is a really tight muscle   Happened last week at home, he then traveled and had a little improvement while away but then pain returned when he was back home   Denies any numbness or tingling  Pain does radiate at times into his thigh   He has been using a heating pad for relief   Has not had imaging of lower back in the past    Problems:  Patient Active Problem List   Diagnosis Date Noted   Mood disorder (HCC) 06/05/2022   Essential hypertension 06/05/2022   Overweight 06/05/2022    Allergies: No Known Allergies Medications:  Current Outpatient Medications:    DULoxetine (CYMBALTA) 60 MG capsule, Take 1 capsule (60 mg total) by mouth daily., Disp: 90 capsule, Rfl: 3   lamoTRIgine (LAMICTAL) 100 MG tablet, Take 2 tablets (200 mg total) by mouth daily., Disp: 180 tablet, Rfl: 3   WEGOVY 1 MG/0.5ML SOAJ, Inject 1 mg into the skin once a week. (Patient not taking: Reported on 01/05/2023),  Disp: 2 mL, Rfl: 11  Observations/Objective: Patient is well-developed, well-nourished in no acute distress.  Resting comfortably  at home.  Head is normocephalic, atraumatic.  No labored breathing.  Speech is clear and coherent with logical content.  Patient is alert and oriented at baseline.    Assessment and Plan:  1. Acute left-sided low back pain without sciatica (Primary)  Discussed need for follow up if pain persists or with numbness/weakness to extremity    -  baclofen (LIORESAL) 10 MG tablet; Take 1 tablet (10 mg total) by mouth 3 (three) times daily.  Dispense: 10 each; Refill: 0    May continue to alternate heat/ice  Ibuprofen 800mg  every 8 hours with food for inflammation and pain relief   Follow Up Instructions: I discussed the assessment and treatment plan with the patient. The patient was provided an opportunity to ask questions and all were answered. The patient agreed with the plan and demonstrated an understanding of the instructions.  A copy of instructions were sent to the patient via MyChart unless otherwise noted below.    The patient was advised to call back or seek an in-person evaluation if the symptoms worsen or if the condition fails to improve as anticipated.    Viviano Simas, FNP

## 2023-01-25 NOTE — Telephone Encounter (Signed)
Copied from CRM [id number]. Topic: Clinical - Red Word Triage >> Jan 25, 2023  1:12 PM Tiffany H wrote: [facility] that prompted transfer to Nurse Triage: PAIN. Patient advised that lower back pain tightness started about a week ago. It comes and goes every other week. It stays for a few days, then leaves. Please assist. Scheduled patient for 01/26/23 visit with Dr. Okey Dupre. Please see patient message via MyChart.  Chief Complaint: back pain  Symptoms: 6/10 left lower back pain that radiates to left mid thigh and cramping  Frequency: started 1 week ago stopped and started back and worse than before Pertinent Negatives: Patient denies numbness and tingling Disposition: [] ED /[] Urgent Care (no appt availability in office) / [] Appointment(In office/virtual)/ [x]  [facility]/ [] Home Care/ [] Refused Recommended Disposition /[] [facility]/ []  Follow-up with PCP Additional Notes: pt took ibuprofen without relief  Reason for Disposition  [1] MODERATE back pain (e.g., interferes with normal activities) AND [2] present > 3 days  Answer Assessment - Initial Assessment Questions 1. ONSET: "When did the pain begin?"      1 week and worse   2. LOCATION: "Where does it hurt?" (upper, mid or lower back)     Left lower back and down leg until mid thigh and buttocks 3. SEVERITY: "How bad is the pain?"  (e.g., Scale 1-10; mild, moderate, or severe)   - MILD (1-3): Doesn't interfere with normal activities.    - MODERATE (4-7): Interferes with normal activities or awakens from sleep.    - SEVERE (8-10): Excruciating pain, unable to do any normal activities.      06/10 4. PATTERN: "Is the pain constant?" (e.g., yes, no; constant, intermittent)      When bend over to put on shoes get worse and now cramps 5. RADIATION: "Does the pain shoot into your legs or somewhere else?"     Down leg 6. CAUSE:  "What do you think is causing the back pain?"      unknown 7. BACK OVERUSE:  "Any recent  lifting of heavy objects, strenuous work or exercise?"     N/a 8. MEDICINES: "What have you taken so far for the pain?" (e.g., nothing, acetaminophen, NSAIDS)     Ibupren, heating pad 9. NEUROLOGIC SYMPTOMS: "Do you have any weakness, numbness, or problems with bowel/bladder control?"     no 10. OTHER SYMPTOMS: "Do you have any other symptoms?" (e.g., fever, abdomen pain, burning with urination, blood in urine)       no 11. PREGNANCY: "Is there any chance you are pregnant?" "When was your last menstrual period?"       no  Protocols used: Back Pain-A-AH

## 2023-01-25 NOTE — Telephone Encounter (Signed)
Is this letting me know that patient would like to schedule a virtual visit or to go ahead an schedule a virtual visit

## 2023-01-25 NOTE — Telephone Encounter (Signed)
Looks like he has a visit already

## 2023-01-26 ENCOUNTER — Ambulatory Visit: Payer: BC Managed Care – PPO | Admitting: Internal Medicine

## 2023-05-06 ENCOUNTER — Ambulatory Visit: Attending: Cardiology | Admitting: Cardiology

## 2023-05-07 ENCOUNTER — Encounter: Payer: Self-pay | Admitting: Cardiology

## 2023-06-01 NOTE — Progress Notes (Deleted)
  Cardiology Office Note:  .   Date:  06/01/2023  ID:  Ricardo Shah, DOB Jan 03, 1981, MRN [id number] PCP: Ricardo Hom[place], MD  [facility] Providers Cardiologist:  Ricardo Gathers, MD { Click to update primary MD,subspecialty MD or APP then REFRESH:1}   History of Present Illness: .   Ricardo Shah is a 43 y.o. male  with history of HTN, HLD,OSA, family history of bicuspid Aortic valve & afib, overweight.  Calcium score 0 01/2022.Echo normal LVEF and aortic valve.  ROS: ***  Studies Reviewed: Ricardo Shah         Prior CV Studies: {Select studies to display:26339}  Calcium score 01/2022 FINDINGS: Coronary arteries: Normal origins.   Coronary Calcium Score:   Left main: 0   Left anterior descending artery: 0   Left circumflex artery: 0   Right coronary artery: 0   Total: 0   Percentile: 0   Pericardium: Normal.   Aorta: Normal caliber of ascending aorta. No aortic atherosclerosis noted.   Non-cardiac: See separate report from [facility].   IMPRESSION: Coronary calcium score of 0. This was 0 percentile for age-, race-, and sex-matched controls.   Echo 12/2021 IMPRESSIONS     1. Left ventricular ejection fraction, by estimation, is >75%. The left  ventricle has hyperdynamic function. The left ventricle has no regional  wall motion abnormalities. There is moderate asymmetric left ventricular  hypertrophy. Left ventricular  diastolic parameters were normal.   2. Right ventricular systolic function is normal. The right ventricular  size is normal.   3. Cannot exlude tiny shunt in interatrial septum (image 48), not  apparent in subcostal views.   4. The mitral valve is normal in structure. Trivial mitral valve  regurgitation.   5. The aortic valve is tricuspid. Aortic valve regurgitation is not  visualized.   6. The inferior vena cava is normal in size with greater than 50%  respiratory variability, suggesting right atrial pressure of 3  mmHg.     Risk Assessment/Calculations:   {Does this patient have ATRIAL FIBRILLATION?:[id number]} No BP recorded.  {Refresh Note OR Click here to enter BP  :1}***       Physical Exam:   VS:  There were no vitals taken for this visit.   Wt Readings from Last 3 Encounters:  01/05/23 211 lb (95.7 kg)  09/14/22 198 lb 9.6 oz (90.1 kg)  06/03/22 219 lb (99.3 kg)    GEN: Well nourished, well developed in no acute distress NECK: No JVD; No carotid bruits CARDIAC: ***RRR, no murmurs, rubs, gallops RESPIRATORY:  Clear to auscultation without rales, wheezing or rhonchi  ABDOMEN: Soft, non-tender, non-distended EXTREMITIES:  No edema; No deformity   ASSESSMENT AND PLAN: .    HTN  HLD-coronary calcium score 0 01/2022  OSA on CPAP  Family history of bicuspid aortic valve but his echo normal Ao valve and LVEF.     {Are you ordering a CV Procedure (e.g. stress test, cath, DCCV, TEE, etc)?   Press F2        :[id number]}  Dispo: ***  Signed, Theotis Flake, PA-C

## 2023-06-07 ENCOUNTER — Telehealth: Admitting: Physician Assistant

## 2023-06-07 DIAGNOSIS — L089 Local infection of the skin and subcutaneous tissue, unspecified: Secondary | ICD-10-CM

## 2023-06-07 DIAGNOSIS — T148XXA Other injury of unspecified body region, initial encounter: Secondary | ICD-10-CM | POA: Diagnosis not present

## 2023-06-07 MED ORDER — AMOXICILLIN-POT CLAVULANATE 875-125 MG PO TABS
1.0000 | ORAL_TABLET | Freq: Two times a day (BID) | ORAL | 0 refills | Status: AC
Start: 2023-06-07 — End: 2023-06-09

## 2023-06-07 MED ORDER — MUPIROCIN 2 % EX OINT: 1.0000 | TOPICAL_OINTMENT | Freq: Two times a day (BID) | CUTANEOUS | 0 refills | Status: DC

## 2023-06-07 NOTE — Progress Notes (Signed)
 E-Visit for Simple Cut/Laceration  We are sorry that you have had an injury. Here is how we plan to help!  Based on what you shared with me it looks like you have a simple laceration. I have prescribed Augmentin 875-125 mg by mouth twice daily for two days and Mupirocin ointment to apply topically twice daily for 5 days.  HOME CARE: Clean the cut or scrape - Wash it well with soap and water. * avoid using hydrogen peroxide which may cause tissue damage, or impede wound healing.  Stop the bleeding - If your cut or scrape is bleeding, press a clean cloth or bandage firmly on the area for 20 minutes. You can also help slow the bleeding by holding the cut above the level of your heart.   Put a thin layer of Bacitracin antibiotic ointment on the cut or scrape. (this can be purchased at any local pharmacy- ask your pharmacist if you need assistance)   Cover the cut or scrape with a bandage or gauze. Keep the bandage clean and dry. Change the bandage 1 to 2 times every day until your cut or scrape heals.   Watch for signs that your cut or scrape is infected (redness, drainage, pain, warmth, swelling or fever)  Over the next 48 hours your wound should start to improve with less pain, less swelling and less redness. If you should develop increasing pain, swelling, redness, fever, pus from the wound you should be seen immediately to make sure this is not becoming infected.   WOUND CARE: Please keep a layer of antibiotic ointment (bacitracin preferred) on this wound at least twice a day for the next seven days and keep a sterile dressing over top of it. You may gently clean the wound with warm soap and water between dressing changes.  We strongly recommend that you have a medical provider reevaluate your wound within 2 to 3 days in person to make sure that it is healing appropriately.  Thank you for choosing an e-visit.  Your e-visit answers were reviewed by a board certified advanced clinical  practitioner to complete your personal care plan. Depending upon the condition, your plan could have included both over the counter or prescription medications.  Please review your pharmacy choice. Make sure the pharmacy is open so you can pick up prescription now. If there is a problem, you may contact your provider through [employer] and have the prescription routed to another pharmacy.  Your safety is important to us . If you have drug allergies check your prescription carefully.   For the next 24 hours you can use MyChart to ask questions about today's visit, request a non-urgent call back, or ask for a work or school excuse. You will get an email in the next two days asking about your experience. I hope that your e-visit has been valuable and will speed your recovery.  I have spent 5 minutes in review of e-visit questionnaire, review and updating patient chart, medical decision making and response to patient.   Angelia Kelp, PA-C

## 2023-06-14 ENCOUNTER — Ambulatory Visit: Admitting: Physician Assistant

## 2023-06-14 NOTE — Progress Notes (Deleted)
 Cardiology Office Note    Date:  06/14/2023  ID:  Ricardo Shah, DOB [birth year], MRN [id number] PCP:  Rollene Almarie LABOR, MD  Cardiologist:  Oneil Parchment, MD  Electrophysiologist:  None   Chief Complaint: ***  History of Present Illness: .    Ricardo Shah is a 43 y.o. male with visit-pertinent history of depression, hypertension and family history of CAD.  Patient initially evaluated in 11/2019 for a family history of cardiac disease.  As reported this patient has history of A-fib and valvular heart disease.  His paternal grandfather with MI in his 80s 86s, uncle with MI in 21s.  Patient was last in clinic on 12/02/2021 by Dr. Hobart.  Patient had remained stable from a cardiac standpoint.  He had recently been started on CPAP therapy.  A cardiac calcium score was ordered as well as an echocardiogram given his father history of bicuspid aortic valve.  Echocardiogram on 12/22/2021 indicated LVEF of greater than 75%, no RWMA, moderate asymmetric LVH however on Dr. Lorrane review was felt to be mild, diastolic parameters were normal, RV systolic function and size was normal, mitral valve was normal in structure with trivial mitral valve regurgitation, aortic valve was tricuspid, regurgitation not visualized.  Patient with coronary calcium score of 0 on 02/04/2022.  Today presents for follow-up.  He reports that he  HTN:  HLD: OSA:   Labwork independently reviewed:   ROS: .   *** denies chest pain, shortness of breath, lower extremity edema, fatigue, palpitations, melena, hematuria, hemoptysis, diaphoresis, weakness, presyncope, syncope, orthopnea, and PND.  All other systems are reviewed and otherwise negative.  Studies Reviewed: SABRA    EKG:  EKG is ordered today, personally reviewed, demonstrating ***     CV Studies: Cardiac studies reviewed are outlined and summarized above. Otherwise please see EMR for full report. Cardiac Studies & Procedures    ______________________________________________________________________________________________     ECHOCARDIOGRAM  ECHOCARDIOGRAM COMPLETE 12/22/2021  Narrative ECHOCARDIOGRAM REPORT    Patient Name:   Ricardo Shah Date of Exam: 12/22/2021 Medical Rec #:  [id number]             Height:       72.0 in Accession #:    [id number]            Weight:       224.4 lb Date of Birth:  Jun 10, 1980             BSA:          2.237 m Patient Age:    41 years              BP:           114/86 mmHg Patient Gender: M                     HR:           80 bpm. Exam Location:  [address]  Procedure: 2D Echo, Cardiac Doppler and Color Doppler  Indications:    R01.1 Murmur  History:        Patient has no prior history of Echocardiogram examinations. Signs/Symptoms:Murmur; Risk Factors:Hypertension, Dyslipidemia and Obesity.  Sonographer:    Elsie Bohr RDCS Referring Phys: [id number] HEATHER E [place]  IMPRESSIONS   1. Left ventricular ejection fraction, by estimation, is >75%. The left ventricle has hyperdynamic function. The left ventricle has no regional wall motion abnormalities. There is moderate asymmetric left ventricular hypertrophy. Left ventricular diastolic parameters were normal. 2.  Right ventricular systolic function is normal. The right ventricular size is normal. 3. Cannot exlude tiny shunt in interatrial septum (image 48), not apparent in subcostal views. 4. The mitral valve is normal in structure. Trivial mitral valve regurgitation. 5. The aortic valve is tricuspid. Aortic valve regurgitation is not visualized. 6. The inferior vena cava is normal in size with greater than 50% respiratory variability, suggesting right atrial pressure of 3 mmHg.  FINDINGS Left Ventricle: Left ventricular ejection fraction, by estimation, is >75%. The left ventricle has hyperdynamic function. The left ventricle has no regional wall motion abnormalities. The left ventricular internal  cavity size was normal in size. There is moderate asymmetric left ventricular hypertrophy. Left ventricular diastolic parameters were normal.  Right Ventricle: The right ventricular size is normal. Right vetricular wall thickness was not assessed. Right ventricular systolic function is normal.  Left Atrium: Left atrial size was normal in size.  Right Atrium: Right atrial size was normal in size.  Pericardium: There is no evidence of pericardial effusion.  Mitral Valve: The mitral valve is normal in structure. Trivial mitral valve regurgitation.  Tricuspid Valve: The tricuspid valve is normal in structure. Tricuspid valve regurgitation is mild.  Aortic Valve: The aortic valve is tricuspid. Aortic valve regurgitation is not visualized.  Pulmonic Valve: The pulmonic valve was normal in structure. Pulmonic valve regurgitation is not visualized.  Aorta: The aortic root and ascending aorta are structurally normal, with no evidence of dilitation.  Venous: The inferior vena cava is normal in size with greater than 50% respiratory variability, suggesting right atrial pressure of 3 mmHg.   LEFT VENTRICLE PLAX 2D LVIDd:         4.80 cm   Diastology LVIDs:         2.70 cm   LV e' medial:    8.81 cm/s LV PW:         1.00 cm   LV E/e' medial:  8.3 LV IVS:        1.50 cm   LV e' lateral:   10.60 cm/s LVOT diam:     2.10 cm   LV E/e' lateral: 6.9 LV SV:         92 LV SV Index:   41 LVOT Area:     3.46 cm   RIGHT VENTRICLE             IVC RV S prime:     21.80 cm/s  IVC diam: 1.20 cm TAPSE (M-mode): 2.5 cm RVSP:           22.5 mmHg  LEFT ATRIUM           Index        RIGHT ATRIUM           Index LA diam:      3.80 cm 1.70 cm/m   RA Pressure: 3.00 mmHg LA Vol (A2C): 54.9 ml 24.54 ml/m  RA Area:     13.60 cm LA Vol (A4C): 52.2 ml 23.33 ml/m  RA Volume:   30.80 ml  13.77 ml/m AORTIC VALVE LVOT Vmax:   132.00 cm/s LVOT Vmean:  89.100 cm/s LVOT VTI:    0.265 m  AORTA Ao Root diam:  3.90 cm Ao Asc diam:  3.30 cm  MITRAL VALVE               TRICUSPID VALVE MV Area (PHT): 2.32 cm    TR Peak grad:   19.5 mmHg MV Decel Time: 327 msec  TR Vmax:        221.00 cm/s MV E velocity: 73.40 cm/s  Estimated RAP:  3.00 mmHg MV A velocity: 59.20 cm/s  RVSP:           22.5 mmHg MV E/A ratio:  1.24 SHUNTS Systemic VTI:  0.26 m Systemic Diam: 2.10 cm  Vina Gull MD Electronically signed by Vina Gull MD Signature Date/Time: 12/22/2021/5:54:36 PM    Final      CT SCANS  CT CARDIAC SCORING (SELF PAY ONLY) 02/02/2022  Addendum 02/04/2022 12:26 PM ADDENDUM REPORT: 02/04/2022 12:23  EXAM: OVER-READ INTERPRETATION  CT CHEST  The following report is an over-read performed by radiologist Dr. Franky Leff [facility] on 02/04/2022. This over-read does not include interpretation of cardiac or coronary anatomy or pathology. The coronary calcium score interpretation by the cardiologist is attached.  COMPARISON:  None.  FINDINGS: Heart is normal size. Aorta normal caliber. No adenopathy. No confluent airspace opacities or effusions. No acute findings in the upper abdomen. Chest wall soft tissues are unremarkable. No acute bony abnormality.  IMPRESSION: No acute or significant extracardiac abnormality.   Electronically Signed By: Franky Crease M.D. On: 02/04/2022 12:23  Narrative CLINICAL DATA:  Cardiovascular Disease Risk stratification  EXAM: Coronary Calcium Score  TECHNIQUE: A gated, non-contrast computed tomography scan of the heart was performed using 3mm slice thickness. Axial images were analyzed on a dedicated workstation. Calcium scoring of the coronary arteries was performed using the Agatston method.  FINDINGS: Coronary arteries: Normal origins.  Coronary Calcium Score:  Left main: 0  Left anterior descending artery: 0  Left circumflex artery: 0  Right coronary artery: 0  Total: 0  Percentile: 0  Pericardium:  Normal.  Aorta: Normal caliber of ascending aorta. No aortic atherosclerosis noted.  Non-cardiac: See separate report from [facility].  IMPRESSION: Coronary calcium score of 0. This was 0 percentile for age-, race-, and sex-matched controls.  RECOMMENDATIONS: Coronary artery calcium (CAC) score is a strong predictor of incident coronary heart disease (CHD) and provides predictive information beyond traditional risk factors. CAC scoring is reasonable to use in the decision to withhold, postpone, or initiate statin therapy in intermediate-risk or selected borderline-risk asymptomatic adults (age 66-75 years and LDL-C >=70 to <190 mg/dL) who do not have diabetes or established atherosclerotic cardiovascular disease (ASCVD).* In intermediate-risk (10-year ASCVD risk >=7.5% to <20%) adults or selected borderline-risk (10-year ASCVD risk >=5% to <7.5%) adults in whom a CAC score is measured for the purpose of making a treatment decision the following recommendations have been made:  If CAC=0, it is reasonable to withhold statin therapy and reassess in 5 to 10 years, as long as higher risk conditions are absent (diabetes mellitus, family history of premature CHD in first degree relatives (males <55 years; females <65 years), cigarette smoking, or LDL >=190 mg/dL).  If CAC is 1 to 99, it is reasonable to initiate statin therapy for patients >=72 years of age.  If CAC is >=100 or >=75th percentile, it is reasonable to initiate statin therapy at any age.  Cardiology referral should be considered for patients with CAC scores >=400 or >=75th percentile.  *2018 AHA/ACC/AACVPR/AAPA/ABC/ACPM/ADA/AGS/APhA/ASPC/NLA/PCNA Guideline on the Management of Blood Cholesterol: A Report of the American College of Cardiology/American Heart Association Task Force on Clinical Practice Guidelines. J Am Coll Cardiol. 2019;73(24):3168-3209.  Shelda Bruckner, MD  Electronically  Signed: By: Shelda Bruckner M.D. On: 02/03/2022 21:11     ______________________________________________________________________________________________       Current Reported  Medications:.    No outpatient medications have been marked as taking for the 06/18/23 encounter (Appointment) with Arlon Bleier D, NP.    Physical Exam:    VS:  There were no vitals taken for this visit.   Wt Readings from Last 3 Encounters:  01/05/23 211 lb (95.7 kg)  09/14/22 198 lb 9.6 oz (90.1 kg)  06/03/22 219 lb (99.3 kg)    GEN: Well nourished, well developed in no acute distress NECK: No JVD; No carotid bruits CARDIAC: ***RRR, no murmurs, rubs, gallops RESPIRATORY:  Clear to auscultation without rales, wheezing or rhonchi  ABDOMEN: Soft, non-tender, non-distended EXTREMITIES:  No edema; No acute deformity     Asessement and Plan:.     ***     Disposition: F/u with ***  Signed, Avya Flavell D Sasha Rogel, NP

## 2023-06-18 ENCOUNTER — Ambulatory Visit: Attending: Cardiology | Admitting: Cardiology

## 2023-06-18 DIAGNOSIS — E785 Hyperlipidemia, unspecified: Secondary | ICD-10-CM

## 2023-06-18 DIAGNOSIS — Z8279 Family history of other congenital malformations, deformations and chromosomal abnormalities: Secondary | ICD-10-CM

## 2023-06-18 DIAGNOSIS — G4733 Obstructive sleep apnea (adult) (pediatric): Secondary | ICD-10-CM

## 2023-06-18 DIAGNOSIS — R011 Cardiac murmur, unspecified: Secondary | ICD-10-CM

## 2023-06-18 DIAGNOSIS — I10 Essential (primary) hypertension: Secondary | ICD-10-CM

## 2023-07-31 ENCOUNTER — Other Ambulatory Visit: Payer: Self-pay | Admitting: Internal Medicine

## 2023-08-10 ENCOUNTER — Ambulatory Visit: Admitting: Internal Medicine

## 2023-08-10 VITALS — BP 118/80 | HR 90 | Temp 98.4°F | Ht 72.0 in | Wt 210.0 lb

## 2023-08-10 DIAGNOSIS — Z0000 Encounter for general adult medical examination without abnormal findings: Secondary | ICD-10-CM | POA: Diagnosis not present

## 2023-08-10 DIAGNOSIS — G4733 Obstructive sleep apnea (adult) (pediatric): Secondary | ICD-10-CM | POA: Diagnosis not present

## 2023-08-10 DIAGNOSIS — I10 Essential (primary) hypertension: Secondary | ICD-10-CM | POA: Diagnosis not present

## 2023-08-10 DIAGNOSIS — F39 Unspecified mood [affective] disorder: Secondary | ICD-10-CM | POA: Diagnosis not present

## 2023-08-10 DIAGNOSIS — E663 Overweight: Secondary | ICD-10-CM

## 2023-08-10 MED ORDER — WEGOVY 1 MG/0.5ML ~~LOC~~ SOAJ: 1.0000 mg | SUBCUTANEOUS | 11 refills | Status: AC

## 2023-08-10 NOTE — Progress Notes (Unsigned)
 Subjective:   Patient ID: Ricardo Shah, male    DOB: 1980/05/26, 43 y.o.   MRN: [id number]  The patient is here for physical. Pertinent topics discussed: Discussed the use of AI scribe software for clinical note transcription with the patient, who gave verbal consent to proceed.  History of Present Illness He experiences significant mood disturbances, including irritability and moodiness, which he attributes to stress at work. He is currently taking Cymbalta  regularly and lamotrigine , although he finds the latter's effects inconsistent, sometimes causing fatigue. He feels his current regimen is not fully effective and wants to discuss his mood management further.  He has a history of sleep apnea and previously used a CPAP machine, which he stopped using after losing weight. He reports that when he uses the CPAP, he sleeps well, but he dislikes the machine's maintenance.  He has been using Wegovy  intermittently for weight management. He initially lost a significant amount of weight but regained it after discontinuing the medication. He is now using it more consistently in an effort to lose weight again. He reports that he previously took antihypertensive medication for high blood pressure, but after losing weight, his blood pressure readings have remained normal and he no longer takes the medication.  PMH, [facility], social history reviewed and updated  Review of Systems  Constitutional: Negative.   HENT: Negative.    Eyes: Negative.   Respiratory:  Negative for cough, chest tightness and shortness of breath.   Cardiovascular:  Negative for chest pain, palpitations and leg swelling.  Gastrointestinal:  Negative for abdominal distention, abdominal pain, constipation, diarrhea, nausea and vomiting.  Musculoskeletal: Negative.   Skin: Negative.   Neurological: Negative.   Psychiatric/Behavioral:  Positive for dysphoric mood.     Objective:  Physical Exam Constitutional:       Appearance: He is well-developed.  HENT:     Head: Normocephalic and atraumatic.  Cardiovascular:     Rate and Rhythm: Normal rate and regular rhythm.  Pulmonary:     Effort: Pulmonary effort is normal. No respiratory distress.     Breath sounds: Normal breath sounds. No wheezing or rales.  Abdominal:     General: Bowel sounds are normal. There is no distension.     Palpations: Abdomen is soft.     Tenderness: There is no abdominal tenderness. There is no rebound.  Musculoskeletal:     Cervical back: Normal range of motion.  Skin:    General: Skin is warm and dry.  Neurological:     Mental Status: He is alert and oriented to person, place, and time.     Coordination: Coordination normal.     Vitals:   08/10/23 1337  BP: 118/80  Pulse: 90  Temp: 98.4 F (36.9 C)  TempSrc: Oral  SpO2: 98%  Weight: 210 lb (95.3 kg)  Height: 6' (1.829 m)    Assessment & Plan:  Assessment and Plan Assessment & Plan Overweight Intermittent Wegovy  use initially led to weight loss, followed by regain. Regular use now aids weight management and improves blood pressure control. Continue Wegovy  and monitor weight and blood pressure regularly.  Major depressive disorder   Irritability and moodiness may be linked to work stress. Current medications, Cymbalta  and lamotrigine , show variable effectiveness. Refer to a mental health specialist for evaluation and potential medication adjustment.  Obstructive sleep apnea   Previously managed with CPAP, which was discontinued after weight loss. Considering resuming CPAP as he prefers it over alternatives like nerve stimulator implants. Resume CPAP use  and consider weight management as a potential factor in improving sleep apnea.  Right hip trochanteric bursitis   Persistent numbness in the right hip with previous stretches and injection providing limited relief. No current pain or significant discomfort. Consider cortisone injection if symptoms  worsen.  Benign skin nevi and freckles   Multiple benign nevi and freckles on the back with no signs of malignancy. Advise routine monitoring of skin changes and use sunscreen during outdoor activities.

## 2023-08-12 DIAGNOSIS — Z0000 Encounter for general adult medical examination without abnormal findings: Secondary | ICD-10-CM | POA: Insufficient documentation

## 2023-08-12 NOTE — Assessment & Plan Note (Signed)
 BP at goal off meds continue close monitoring. Checking CBC, CMP, lipid panel.

## 2023-08-12 NOTE — Assessment & Plan Note (Signed)
 Intermittent Wegovy  use initially led to weight loss, followed by regain. Regular use now aids weight management and improves blood pressure control. Continue Wegovy  and monitor weight and blood pressure regularly.

## 2023-08-12 NOTE — Assessment & Plan Note (Signed)
 Irritability and moodiness may be linked to work stress. Current medications, Cymbalta  and lamotrigine , show variable effectiveness. Refer to a mental health specialist for evaluation and potential medication adjustment.

## 2023-08-12 NOTE — Assessment & Plan Note (Signed)
 Flu shot yearly. Tetanus up to date. Counseled about sun safety and mole surveillance. Counseled about the dangers of distracted driving. Given 10 year screening recommendations.

## 2023-08-12 NOTE — Assessment & Plan Note (Signed)
 Resume CPAP regularly.

## 2023-08-24 ENCOUNTER — Other Ambulatory Visit: Payer: Self-pay | Admitting: Internal Medicine

## 2023-08-24 ENCOUNTER — Encounter: Payer: Self-pay | Admitting: Internal Medicine

## 2023-08-24 MED ORDER — HYDROCORTISONE (PERIANAL) 2.5 % EX CREA: 1.0000 | TOPICAL_CREAM | Freq: Two times a day (BID) | CUTANEOUS | 0 refills | Status: AC

## 2023-08-25 ENCOUNTER — Ambulatory Visit: Admitting: Family Medicine

## 2023-09-27 ENCOUNTER — Encounter: Payer: Self-pay | Admitting: Internal Medicine

## 2023-09-27 ENCOUNTER — Ambulatory Visit: Payer: Self-pay

## 2023-09-27 NOTE — Telephone Encounter (Signed)
 FYI Only or Action Required?: Action required by provider: medication refill request. Pt is requesting a prescription for prednisone . Advised that medication may not be prescribed w/o office visit. OV scheduled for tomorrow afternoon  Patient was last seen in primary care on 08/10/2023 by Rollene Almarie LABOR, MD.  Called Nurse Triage reporting Back Pain.  Symptoms began several days ago.  Interventions attempted: Prescription medications: Muscle relaxer and Tylenol .  Symptoms are: unchanged.  Triage Disposition: See PCP When Office is Open (Within 3 Days)  Patient/caregiver understands and will follow disposition?: Unsure                      Copied from CRM #[id number]. Topic: Clinical - Medical Advice >> Sep 27, 2023 10:19 AM Sophia H wrote: Reason for CRM: Patient is calling in about his my chart message that was sent in regarding medical/medication advice. NT - clinic not available. Having pinched nerve Reason for Disposition  [1] MODERATE back pain (e.g., interferes with normal activities) AND [2] present > 3 days  Answer Assessment - Initial Assessment Questions Pt states that pain began Saturday afternoon. No known injury.  Pt is requesting a prescription for prednisone . Advised that medication may not be prescribed w/o office visit. OV scheduled for tomorrow afternoon.    1. ONSET: When did the pain begin? (e.g., minutes, hours, days)     Saturday 2. LOCATION: Where does it hurt? (upper, mid or lower back)     Lower left  3. SEVERITY: How bad is the pain?  (e.g., Scale 1-10; mild, moderate, or severe)     Not moving - 3/10, when moving 8/10 4. PATTERN: Is the pain constant? (e.g., yes, no; constant, intermittent)      Comes and goes 5. RADIATION: Does the pain shoot into your legs or somewhere else?     A little into legs and into groin 6. CAUSE:  What do you think is causing the back pain?      unsure 7. BACK OVERUSE:  Any recent lifting  of heavy objects, strenuous work or exercise?     no 8. MEDICINES: What have you taken so far for the pain? (e.g., nothing, acetaminophen , NSAIDS)     Muscle relaxer 9. NEUROLOGIC SYMPTOMS: Do you have any weakness, numbness, or problems with bowel/bladder control?     no 10. OTHER SYMPTOMS: Do you have any other symptoms? (e.g., fever, abdomen pain, burning with urination, blood in urine)       no  Protocols used: Back Pain-A-AH

## 2023-09-28 ENCOUNTER — Encounter: Payer: Self-pay | Admitting: Internal Medicine

## 2023-09-28 ENCOUNTER — Ambulatory Visit (INDEPENDENT_AMBULATORY_CARE_PROVIDER_SITE_OTHER): Admitting: Internal Medicine

## 2023-09-28 VITALS — BP 118/86 | HR 81 | Temp 98.8°F | Resp 16 | Ht 72.0 in | Wt 206.0 lb

## 2023-09-28 DIAGNOSIS — M5442 Lumbago with sciatica, left side: Secondary | ICD-10-CM | POA: Diagnosis not present

## 2023-09-28 DIAGNOSIS — Z0000 Encounter for general adult medical examination without abnormal findings: Secondary | ICD-10-CM

## 2023-09-28 LAB — COMPREHENSIVE METABOLIC PANEL WITH GFR
ALT: 19 U/L (ref 0–53)
AST: 20 U/L (ref 0–37)
Albumin: 4.5 g/dL (ref 3.5–5.2)
Alkaline Phosphatase: 78 U/L (ref 39–117)
BUN: 10 mg/dL (ref 6–23)
CO2: 28 meq/L (ref 19–32)
Calcium: 9.4 mg/dL (ref 8.4–10.5)
Chloride: 102 meq/L (ref 96–112)
Creatinine, Ser: 0.9 mg/dL (ref 0.40–1.50)
GFR: 104.06 mL/min (ref >60.00)
Glucose, Bld: 86 mg/dL (ref 70–99)
Potassium: 3.9 meq/L (ref 3.5–5.1)
Sodium: 136 meq/L (ref 135–145)
Total Bilirubin: 0.6 mg/dL (ref 0.2–1.2)
Total Protein: 7.1 g/dL (ref 6.0–8.3)

## 2023-09-28 LAB — LIPID PANEL
Cholesterol: 177 mg/dL (ref 0–200)
HDL: 39.3 mg/dL (ref >39.00)
LDL Cholesterol: 115 mg/dL — ABNORMAL HIGH (ref 0–99)
NonHDL: 137.77
Total CHOL/HDL Ratio: 5
Triglycerides: 115 mg/dL (ref 0.0–149.0)
VLDL: 23 mg/dL (ref 0.0–40.0)

## 2023-09-28 LAB — CBC
HCT: 42.6 % (ref 39.0–52.0)
Hemoglobin: 14.2 g/dL (ref 13.0–17.0)
MCHC: 33.3 g/dL (ref 30.0–36.0)
MCV: 88.5 fl (ref 78.0–100.0)
Platelets: 303 K/uL (ref 150.0–400.0)
RBC: 4.81 Mil/uL (ref 4.22–5.81)
RDW: 13 % (ref 11.5–15.5)
WBC: 4.3 K/uL (ref 4.0–10.5)

## 2023-09-28 LAB — VITAMIN B12: Vitamin B-12: 211 pg/mL (ref 211–911)

## 2023-09-28 LAB — VITAMIN D 25 HYDROXY (VIT D DEFICIENCY, FRACTURES): VITD: 25.98 ng/mL — ABNORMAL LOW (ref 30.00–100.00)

## 2023-09-28 LAB — TSH: TSH: 0.86 u[IU]/mL (ref 0.35–5.50)

## 2023-09-28 MED ORDER — PREDNISONE 20 MG PO TABS: 40.0000 mg | ORAL_TABLET | Freq: Every day | ORAL | 0 refills | Status: AC

## 2023-09-28 NOTE — Patient Instructions (Signed)
We have sent in the prednisone to take 2 pills daily for 5 days.   

## 2023-09-28 NOTE — Progress Notes (Signed)
   Subjective:   Patient ID: Ricardo Shah, male    DOB: 07-26-1980, 43 y.o.   MRN: [id number]  Discussed the use of AI scribe software for clinical note transcription with the patient, who gave verbal consent to proceed.  History of Present Illness Ricardo Shah is a 43 year old male who presents with low back pain radiating to the thigh.  He has been experiencing low back pain since Saturday afternoon following physical activities over the weekend. The pain is described as a 'pinched' sensation located very low in the back, radiating down into the thigh, causing soreness.  He has been mostly resting since the onset of pain, finding relief when lying down. However, he experiences difficulty with certain movements, such as lifting his leg to put on pants, and sitting is uncomfortable. He is able to walk but avoids prolonged sitting.  He has tried muscle relaxants, including baclofen  and methocarbamol, with limited success. Methocarbamol initially provided significant relief, but its effectiveness diminished with continued use.  No numbness, loss of sensation, or bowel and bladder dysfunction is reported. He has no history of similar episodes with radiation into the thigh.     Review of Systems  Constitutional:  Positive for activity change. Negative for appetite change, fatigue, fever and unexpected weight change.  Respiratory: Negative.    Cardiovascular: Negative.   Musculoskeletal:  Positive for back pain and myalgias. Negative for arthralgias.  Skin: Negative.   Neurological:  Negative for syncope, weakness and numbness.    Objective:  Physical Exam Constitutional:      Appearance: Normal appearance.  HENT:     Head: Normocephalic.  Cardiovascular:     Rate and Rhythm: Normal rate and regular rhythm.  Pulmonary:     Effort: Pulmonary effort is normal.  Musculoskeletal:        General: Tenderness present. Normal range of motion.  Skin:    General: Skin is  warm and dry.  Neurological:     General: No focal deficit present.     Mental Status: He is alert and oriented to person, place, and time.     Vitals:   09/28/23 1436  BP: 118/86  Pulse: 81  Resp: 16  Temp: 98.8 F (37.1 C)  SpO2: 98%  Weight: 206 lb (93.4 kg)  Height: 6' (1.829 m)    Assessment and Plan Assessment & Plan Acute low back pain with lumbar radiculopathy   He experiences acute low back pain radiating to the thigh, likely from nerve compression or muscle spasm. Symptoms have improved but persist. There are no sensory or bowel/bladder issues. Muscle relaxants were initially effective but are now ineffective. SI joint or muscle spasm involvement is considered. A 5-day prednisone  course is prescribed to reduce inflammation. Continue ibuprofen for pain and inflammation. Ice or heat application is recommended for symptom relief. Steroid injection was discussed, but he prefers oral medication.

## 2023-09-29 LAB — TESTOSTERONE TOTAL,FREE,BIO, MALES
Albumin: 4.5 g/dL (ref 3.6–5.1)
Sex Hormone Binding: 107 nmol/L — ABNORMAL HIGH (ref 10–50)
Testosterone, Bioavailable: 80.3 ng/dL — ABNORMAL LOW (ref 110.0–575.0)
Testosterone, Free: 39 pg/mL — ABNORMAL LOW (ref 46.0–224.0)
Testosterone: 816 ng/dL (ref 250–827)

## 2023-10-04 ENCOUNTER — Ambulatory Visit: Payer: Self-pay | Admitting: Internal Medicine

## 2023-10-04 DIAGNOSIS — R7989 Other specified abnormal findings of blood chemistry: Secondary | ICD-10-CM

## 2023-11-29 ENCOUNTER — Encounter: Payer: Self-pay | Admitting: *Deleted

## 2023-11-30 ENCOUNTER — Ambulatory Visit: Attending: Internal Medicine | Admitting: Internal Medicine

## 2023-11-30 ENCOUNTER — Encounter: Payer: Self-pay | Admitting: Internal Medicine

## 2023-11-30 VITALS — BP 122/85 | HR 78 | Ht 72.0 in | Wt 211.8 lb

## 2023-11-30 DIAGNOSIS — Z8279 Family history of other congenital malformations, deformations and chromosomal abnormalities: Secondary | ICD-10-CM

## 2023-11-30 DIAGNOSIS — E785 Hyperlipidemia, unspecified: Secondary | ICD-10-CM

## 2023-11-30 DIAGNOSIS — R011 Cardiac murmur, unspecified: Secondary | ICD-10-CM | POA: Diagnosis not present

## 2023-11-30 DIAGNOSIS — I10 Essential (primary) hypertension: Secondary | ICD-10-CM | POA: Diagnosis not present

## 2023-11-30 DIAGNOSIS — Z7189 Other specified counseling: Secondary | ICD-10-CM | POA: Diagnosis not present

## 2023-11-30 NOTE — Patient Instructions (Signed)
 Medication Instructions:  No Changes *If you need a refill on your cardiac medications before your next appointment, please call your pharmacy*  Lab Work: None  Follow-Up: At [facility], you and your health needs are our priority.  As part of our continuing mission to provide you with exceptional heart care, our providers are all part of one team.  This team includes your primary Cardiologist (physician) and Advanced Practice Providers or APPs (Physician Assistants and Nurse Practitioners) who all work together to provide you with the care you need, when you need it.  Your next appointment:   1 year(s)  Provider:   Soyla DELENA Merck, MD    Other Instructions Please check your Blood Pressure at least once per week; get in touch with us  if you see that it is consistently higher than 140/90 and/or if you don't feel well.

## 2023-11-30 NOTE — Progress Notes (Signed)
 Cardiology Office Note:  .   Date:  11/30/2023  ID:  Ricardo Shah, DOB 1980/06/10, MRN [id number] PCP: Rollene Almarie LABOR, MD  [facility] Providers Cardiologist:  Soyla LABOR Merck, MD    History of Present Illness: .   Ricardo Shah is a 43 y.o. male.  Discussed the use of AI scribe software for clinical note transcription with the patient, who gave verbal consent to proceed.  History of Present Illness Ricardo Shah is a 43 year old male who presents for a cardiovascular evaluation due to a family history of heart valve issues.  His father has tricuspid valve disease, atrial fibrillation, prior open-heart surgery, and a recent valvular leak that improved with weight loss. Father has a bicuspid aortic valve.  His LDL was 115 mg/dL in September 2025, improved from 150 mg/dL without lipid-lowering medication.  A few weeks ago he had a single episode of marked dizziness with vomiting without loss of consciousness or known blood pressure change. For about a year he has had pressure-like headaches when lying down, which he associates with semaglutide . An eye exam showed only dry eyes.  He has hypertension previously treated with amlodipine , which he stopped due to fatigue and better blood pressure after weight loss. He is taking semaglutide  for weight loss and has lost nearly 20 pounds since his last visit. He sometimes takes ibuprofen for headaches.  He has three children and a sister who may need cardiovascular screening given his father's cardiac history. He works in airline pilot for [employer].    ROS: negative except per HPI above.  Studies Reviewed: SABRA   EKG Interpretation Date/Time:  Tuesday November 30 2023 07:52:30 EST Ventricular Rate:  78 PR Interval:  162 QRS Duration:  102 QT Interval:  360 QTC Calculation: 410 R Axis:   73  Text Interpretation: Normal sinus rhythm Normal ECG Confirmed by Merck Soyla ([id number]) on 11/30/2023  8:18:26 AM    Results LABS LDL: 115 (09/2023) Lipoprotein A: 27  RADIOLOGY Calcium score: Normal (01/2022)  DIAGNOSTIC Echocardiogram: Normal (12/2021) EKG: Normal (11/30/2023) Risk Assessment/Calculations:       Physical Exam:   VS:  BP 122/85 (BP Location: Left Arm, Patient Position: Sitting)   Pulse 78   Ht 6' (1.829 m)   Wt 211 lb 12.8 oz (96.1 kg)   SpO2 97%   BMI 28.73 kg/m    Wt Readings from Last 3 Encounters:  11/30/23 211 lb 12.8 oz (96.1 kg)  09/28/23 206 lb (93.4 kg)  08/10/23 210 lb (95.3 kg)     Physical Exam GENERAL: Alert, cooperative, well developed, no acute distress. HEENT: Normocephalic, normal oropharynx, moist mucous membranes. CHEST: Clear to auscultation bilaterally, no wheezes, rhonchi, or crackles. CARDIOVASCULAR: Normal heart rate and rhythm, S1 and S2 normal without murmurs. ABDOMEN: Soft, non-tender, non-distended, without organomegaly, normal bowel sounds. EXTREMITIES: No cyanosis or edema. NEUROLOGICAL: Cranial nerves grossly intact, moves all extremities without gross motor or sensory deficit.   ASSESSMENT AND PLAN: .    Assessment and Plan Assessment & Plan Essential hypertension Blood pressure controlled with lifestyle changes. No antihypertensive medication needed. - Check blood pressure weekly. - Monitor for blood pressure >130/80 mmHg.  Hyperlipidemia Cardiac risk counseling LDL improved to 115 mg/dL with lifestyle changes. Low cardiovascular risk with normal lipoprotein a and calcium score of 0. No statin needed at this point. - Continue diet and lifestyle modifications. - Aim for LDL <100 mg/dL in 12 months.  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 No concerning  eye findings.  - Consider eye doctor follow-up if headaches persist. - Monitor headache frequency and severity. - Consider neurology referral if headaches worsen.  Acute dizziness episode Single dizziness episode with vomiting, possibly  due to illness or blood pressure fluctuations. - Monitor for dizziness recurrence. - Check blood pressure during dizziness episodes.        Soyla Merck, MD, FACC

## 2023-12-09 ENCOUNTER — Ambulatory Visit: Admitting: Behavioral Health

## 2024-01-22 ENCOUNTER — Encounter: Payer: Self-pay | Admitting: Internal Medicine
